# Patient Record
Sex: Female | Born: 1940 | Race: White | Hispanic: No | Smoking: Former smoker
Health system: Southern US, Community
[De-identification: ages and names within clinical notes are randomized; demographics above are authoritative.]

## PROBLEM LIST (undated history)

## (undated) DIAGNOSIS — Z960 Presence of urogenital implants: Secondary | ICD-10-CM

## (undated) DIAGNOSIS — I10 Essential (primary) hypertension: Secondary | ICD-10-CM

## (undated) DIAGNOSIS — N1330 Unspecified hydronephrosis: Secondary | ICD-10-CM

## (undated) DIAGNOSIS — I252 Old myocardial infarction: Secondary | ICD-10-CM

## (undated) DIAGNOSIS — N814 Uterovaginal prolapse, unspecified: Secondary | ICD-10-CM

## (undated) DIAGNOSIS — K219 Gastro-esophageal reflux disease without esophagitis: Secondary | ICD-10-CM

## (undated) DIAGNOSIS — N8111 Cystocele, midline: Secondary | ICD-10-CM

## (undated) DIAGNOSIS — D649 Anemia, unspecified: Secondary | ICD-10-CM

## (undated) DIAGNOSIS — E785 Hyperlipidemia, unspecified: Secondary | ICD-10-CM

## (undated) DIAGNOSIS — R609 Edema, unspecified: Secondary | ICD-10-CM

## (undated) DIAGNOSIS — Z978 Presence of other specified devices: Secondary | ICD-10-CM

## (undated) HISTORY — PX: TUBAL LIGATION: SHX77

## (undated) HISTORY — DX: Old myocardial infarction: I25.2

## (undated) HISTORY — PX: UPPER GASTROINTESTINAL ENDOSCOPY: SHX188

## (undated) HISTORY — PX: CHOLECYSTECTOMY: SHX55

## (undated) HISTORY — DX: Cystocele, midline: N81.11

## (undated) HISTORY — PX: EYE SURGERY: SHX253

## (undated) HISTORY — DX: Uterovaginal prolapse, unspecified: N81.4

## (undated) HISTORY — DX: Essential (primary) hypertension: I10

## (undated) HISTORY — PX: CATARACT EXTRACTION: SUR2

## (undated) HISTORY — DX: Gastro-esophageal reflux disease without esophagitis: K21.9

## (undated) HISTORY — DX: Hyperlipidemia, unspecified: E78.5

## (undated) HISTORY — DX: Unspecified hydronephrosis: N13.30

---

## 2015-01-18 ENCOUNTER — Ambulatory Visit (INDEPENDENT_AMBULATORY_CARE_PROVIDER_SITE_OTHER): Payer: BLUE CROSS/BLUE SHIELD | Admitting: Cardiology

## 2015-01-18 ENCOUNTER — Encounter: Payer: Self-pay | Admitting: Cardiology

## 2015-01-18 ENCOUNTER — Encounter: Payer: Self-pay | Admitting: *Deleted

## 2015-01-18 VITALS — BP 182/71 | HR 65 | Ht <= 58 in | Wt 111.4 lb

## 2015-01-18 DIAGNOSIS — R0989 Other specified symptoms and signs involving the circulatory and respiratory systems: Secondary | ICD-10-CM

## 2015-01-18 DIAGNOSIS — Z01818 Encounter for other preprocedural examination: Secondary | ICD-10-CM

## 2015-01-18 DIAGNOSIS — R011 Cardiac murmur, unspecified: Secondary | ICD-10-CM

## 2015-01-18 DIAGNOSIS — I252 Old myocardial infarction: Secondary | ICD-10-CM | POA: Diagnosis not present

## 2015-01-18 DIAGNOSIS — I10 Essential (primary) hypertension: Secondary | ICD-10-CM | POA: Diagnosis not present

## 2015-01-18 MED ORDER — CARVEDILOL 3.125 MG PO TABS: 3.1250 mg | ORAL_TABLET | Freq: Two times a day (BID) | ORAL | Status: DC

## 2015-01-18 NOTE — Patient Instructions (Addendum)
Medication Instructions:  1.  Start Coreg 3.125 take 2 tablets twice daily    Labwork: BMET  TODAY  Testing/Procedures: Your physician has requested that you have an echocardiogram. Echocardiography is a painless test that uses sound waves to create images of your heart. It provides your doctor with information about the size and shape of your heart and how well your heart's chambers and valves are working. This procedure takes approximately one hour. There are no restrictions for this procedure.  Your physician has requested that you have a carotid duplex. This test is an ultrasound of the carotid arteries in your neck. It looks at blood flow through these arteries that supply the brain with blood. Allow one hour for this exam. There are no restrictions or special instructions.  Your physician has requested that you have an exercise stress myoview. For further information please visit [email or web address]. Please follow instruction sheet, as given.  If these can be done before 01-26-15  Follow-Up: Your physician recommends that you schedule a follow-up appointment in: 3 months with Dr. Irish Lack  Return to see the Pharm D for BP check (if possible on the same day as one of the Stress Test)  Any Other Special Instructions Will Be Listed Below (If Applicable).

## 2015-01-18 NOTE — Progress Notes (Signed)
Cardiology Office Note  NEW PATIENT  Date:  01/18/2015   ID:  Mariah Phillips, DOB [birth year], MRN [id number]  PCP:  No primary care provider on file.  Cardiologist:  Dr. Irish Lack    Chief Complaint  Patient presents with  . Hypertension    uncontrolled and cardiac risk for vag. hyterectomy      History of Present Illness: Mariah Phillips is a 74 y.o. female who presents for cardiac evaluation for cardiac risk for total vaginal hysterectomy surgery and uncontrolled HTN.    In 1984 pt was told she had a heart attack with cholecystectomy.  She never saw a cardiologist. No stress test.  She has no chest pain and no SOB.  No hx of CVA, no diabetes, no thyroid disease.  Hx of HTN and was well controlled on lisinopril but recently her kidney function was abnormal and she was changed to amlodipine and developed lower ext edema.  Meds have since been adjusted but BP has been elevated and difficult to control at times to 008 systolic. She has been given clonidine to take prn, and has taken for systolic > 676 4 times in 1 month.    She has prolapsed uterus and bladder needs tacking. plans for surgery once we have evaluated and BP with improved control.    She is active working night shift at [employer] most of the night.      Past Medical History  Diagnosis Date  . Hypertension   . Uterine prolapse   . Hydronephrosis     bilateral  . Esophageal reflux   . Cystocele, midline   . Old myocardial infarction   . Hyperlipidemia     Past Surgical History  Procedure Laterality Date  . Cholecystectomy    . Cataract extraction  2000/2010     Current Outpatient Prescriptions  Medication Sig Dispense Refill  . cloNIDine (CATAPRES) 0.1 MG tablet Take 1 tablet by mouth daily as needed. If BP is over 170  0  . hydrochlorothiazide (HYDRODIURIL) 12.5 MG tablet Take 1 tablet by mouth daily.  0  . IRON PO Take 1 tablet by mouth 2 (two) times daily. 65 mg    .  lisinopril-hydrochlorothiazide (PRINZIDE,ZESTORETIC) 20-12.5 MG per tablet Take 1 tablet by mouth daily.  4  . omeprazole (PRILOSEC) 20 MG capsule Take 1 capsule by mouth daily.  4  . pravastatin (PRAVACHOL) 20 MG tablet Take 20 mg by mouth daily.    . carvedilol (COREG) 3.125 MG tablet Take 1 tablet (3.125 mg total) by mouth 2 (two) times daily. 180 tablet 3   No current facility-administered medications for this visit.    Allergies:   Amlodipine    Social History:  The patient  reports that she has quit smoking. She does not have any smokeless tobacco history on file.   Family History:  The patient's family history includes Heart Problems in her father; Thyroid cancer in her mother.    ROS:  General:no colds or fevers, no weight changes Skin:no rashes or ulcers HEENT:no blurred vision, no congestion CV:see HPI PUL:see HPI GI:no diarrhea constipation or melena, no indigestion GU:no hematuria, no dysuria MS:no joint pain, no claudication Neuro:no syncope, no lightheadedness Endo:no diabetes, no thyroid disease GU as before  Wt Readings from Last 3 Encounters:  01/18/15 111 lb 6.4 oz (50.531 kg)     PHYSICAL EXAM: VS:  BP 182/71 mmHg  Pulse 65  Ht 4\' 7"  (1.397 m)  Wt 111 lb 6.4 oz (50.531  kg)  BMI 25.89 kg/m2 , BMI Body mass index is 25.89 kg/(m^2). General:Pleasant affect, NAD Skin:Warm and dry, brisk capillary refill HEENT:normocephalic, sclera clear, mucus membranes moist Neck:supple, no JVD, + rt carotid bruits ? Soft Lt bruit, and rt subclavian bruit.  Heart:S1S2 RRR with 2/6 systolic outflow murmur, no gallup, rub or click Lungs:clear without rales, rhonchi, or wheezes IDP:OEUM, non tender, + BS, do not palpate liver spleen or masses Ext:no lower ext edema, 2+ pedal pulses, 2+ radial pulses Neuro:alert and oriented X 3, MAE, follows commands, + facial symmetry    EKG:  EKG is ordered today. The ekg ordered today demonstrates SR no acute abnormality.     Recent Labs: No results found for requested labs within last 365 days.    Lipid Panel No results found for: CHOL, TRIG, HDL, CHOLHDL, VLDL, LDLCALC, LDLDIRECT     Other studies Reviewed: Additional studies/ records that were reviewed today include: gyn notes.   ASSESSMENT AND PLAN:  1.  HTN uncontrolled- add coreg 3.126 BID to medication and follow up with pharmacy clinic for BP control, will check BMP today. Will follow up with Dr. Irish Lack for routine follow up depending on results of tests.  2. + murmur, check Echo  3. + carotid and subclavian bruit.check carotid dopplers.  4. Hx of MI, no records, will do exercise myoview to eval risk factor for surgery.   Will call her with those results.  If abnormal may need further testing.   5. Hyperlipidemia followed by PCP on statin, continue   Current medicines are reviewed with the patient today.  The patient Has no concerns regarding medicines.  The following changes have been made:  See above Labs/ tests ordered today include:see above  Disposition:   FU:  see above  Signed, Isaiah Serge, NP  01/18/2015 5:11 PM    [facility] [place], [place], [place] [address], [place] Phone: [phone]; Fax: [phone]  I have examined the patient and reviewed assessment and plan and discussed with patient.  Agree with above as stated.  History of MI. Now needs hysterectomy. Will check carotid Doppler due to bruit. Check nuclear stress test to rule out ischemia. Would also check echocardiogram to evaluate for structural heart disease given murmur on exam.  VARANASI,JAYADEEP S.

## 2015-01-19 ENCOUNTER — Telehealth: Payer: Self-pay | Admitting: *Deleted

## 2015-01-19 ENCOUNTER — Telehealth (HOSPITAL_COMMUNITY): Payer: Self-pay | Admitting: Radiology

## 2015-01-19 LAB — BASIC METABOLIC PANEL
BUN: 27 mg/dL — ABNORMAL HIGH (ref 6–23)
CO2: 28 mEq/L (ref 19–32)
Calcium: 8.9 mg/dL (ref 8.4–10.5)
Chloride: 103 mEq/L (ref 96–112)
Creatinine, Ser: 1.44 mg/dL — ABNORMAL HIGH (ref 0.40–1.20)
GFR: 37.9 mL/min — ABNORMAL LOW (ref >60.00)
Glucose, Bld: 89 mg/dL (ref 70–99)
Potassium: 3.8 mEq/L (ref 3.5–5.1)
Sodium: 142 mEq/L (ref 135–145)

## 2015-01-19 NOTE — Telephone Encounter (Signed)
Received direct call from pt who is calling for clarification on Coreg.  I reviewed chart and told pt she should take Coreg 3.125 mg by mouth twice daily. Pt confirms this is the directions on the bottle she picked up from the pharmacy.

## 2015-01-19 NOTE — Addendum Note (Signed)
Addended by: Michae Kava on: 01/19/2015 12:07 PM   Modules accepted: Orders, SmartSet

## 2015-01-19 NOTE — Telephone Encounter (Signed)
Patient given detailed instructions per Myocardial Perfusion Study Information Sheet for test on 01/23/15 at 8:30. Patient Notified to arrive 15 minutes early, and that it is imperative to arrive on time for appointment to keep from having the test rescheduled. Patient verbalized understanding. EHK

## 2015-01-22 ENCOUNTER — Ambulatory Visit: Payer: Self-pay | Admitting: Cardiology

## 2015-01-23 ENCOUNTER — Ambulatory Visit (INDEPENDENT_AMBULATORY_CARE_PROVIDER_SITE_OTHER): Payer: BLUE CROSS/BLUE SHIELD | Admitting: Pharmacist

## 2015-01-23 ENCOUNTER — Ambulatory Visit (INDEPENDENT_AMBULATORY_CARE_PROVIDER_SITE_OTHER): Payer: BLUE CROSS/BLUE SHIELD

## 2015-01-23 ENCOUNTER — Encounter: Payer: Self-pay | Admitting: Pharmacist

## 2015-01-23 ENCOUNTER — Telehealth: Payer: Self-pay | Admitting: *Deleted

## 2015-01-23 ENCOUNTER — Ambulatory Visit (HOSPITAL_COMMUNITY)
Admission: RE | Admit: 2015-01-23 | Discharge: 2015-01-23 | Disposition: A | Discharge status: Home / Self Care | Payer: BLUE CROSS/BLUE SHIELD | Source: Ambulatory Visit | Attending: Cardiology | Admitting: Cardiology

## 2015-01-23 ENCOUNTER — Ambulatory Visit (HOSPITAL_BASED_OUTPATIENT_CLINIC_OR_DEPARTMENT_OTHER): Payer: BLUE CROSS/BLUE SHIELD

## 2015-01-23 ENCOUNTER — Other Ambulatory Visit: Payer: Self-pay

## 2015-01-23 VITALS — BP 162/84 | HR 64 | Ht <= 58 in | Wt 112.0 lb

## 2015-01-23 DIAGNOSIS — E785 Hyperlipidemia, unspecified: Secondary | ICD-10-CM | POA: Diagnosis not present

## 2015-01-23 DIAGNOSIS — I252 Old myocardial infarction: Secondary | ICD-10-CM | POA: Diagnosis not present

## 2015-01-23 DIAGNOSIS — Z01818 Encounter for other preprocedural examination: Secondary | ICD-10-CM

## 2015-01-23 DIAGNOSIS — I352 Nonrheumatic aortic (valve) stenosis with insufficiency: Secondary | ICD-10-CM | POA: Diagnosis not present

## 2015-01-23 DIAGNOSIS — Z87891 Personal history of nicotine dependence: Secondary | ICD-10-CM | POA: Insufficient documentation

## 2015-01-23 DIAGNOSIS — I10 Essential (primary) hypertension: Secondary | ICD-10-CM | POA: Diagnosis not present

## 2015-01-23 DIAGNOSIS — R011 Cardiac murmur, unspecified: Secondary | ICD-10-CM | POA: Insufficient documentation

## 2015-01-23 DIAGNOSIS — R0989 Other specified symptoms and signs involving the circulatory and respiratory systems: Secondary | ICD-10-CM | POA: Diagnosis not present

## 2015-01-23 DIAGNOSIS — I6523 Occlusion and stenosis of bilateral carotid arteries: Secondary | ICD-10-CM | POA: Diagnosis not present

## 2015-01-23 DIAGNOSIS — I071 Rheumatic tricuspid insufficiency: Secondary | ICD-10-CM | POA: Diagnosis not present

## 2015-01-23 DIAGNOSIS — I2510 Atherosclerotic heart disease of native coronary artery without angina pectoris: Secondary | ICD-10-CM

## 2015-01-23 DIAGNOSIS — I340 Nonrheumatic mitral (valve) insufficiency: Secondary | ICD-10-CM | POA: Insufficient documentation

## 2015-01-23 DIAGNOSIS — Z01810 Encounter for preprocedural cardiovascular examination: Secondary | ICD-10-CM | POA: Diagnosis not present

## 2015-01-23 DIAGNOSIS — R079 Chest pain, unspecified: Secondary | ICD-10-CM | POA: Diagnosis not present

## 2015-01-23 HISTORY — DX: Essential (primary) hypertension: I10

## 2015-01-23 LAB — MYOCARDIAL PERFUSION IMAGING
LV dias vol: 75 mL
LV sys vol: 28 mL
Peak HR: 98 {beats}/min
RATE: 0.23
Rest HR: 54 {beats}/min
SDS: 1
SRS: 0
SSS: 1
TID: 1.21

## 2015-01-23 MED ORDER — TECHNETIUM TC 99M SESTAMIBI GENERIC - CARDIOLITE
31.6000 | Freq: Once | INTRAVENOUS | Status: AC | PRN
  Administered 2015-01-23: 32 via INTRAVENOUS

## 2015-01-23 MED ORDER — TECHNETIUM TC 99M SESTAMIBI GENERIC - CARDIOLITE
10.9000 | Freq: Once | INTRAVENOUS | Status: AC | PRN
  Administered 2015-01-23: 10.9 via INTRAVENOUS

## 2015-01-23 MED ORDER — LISINOPRIL 20 MG PO TABS: 20.0000 mg | ORAL_TABLET | Freq: Every day | ORAL | Status: DC

## 2015-01-23 MED ORDER — CLONIDINE HCL 0.1 MG PO TABS: 0.1000 mg | ORAL_TABLET | Freq: Two times a day (BID) | ORAL | Status: DC

## 2015-01-23 MED ORDER — REGADENOSON 0.4 MG/5ML IV SOLN
0.4000 mg | Freq: Once | INTRAVENOUS | Status: AC
  Administered 2015-01-23: 0.4 mg via INTRAVENOUS

## 2015-01-23 MED ORDER — HYDROCHLOROTHIAZIDE 25 MG PO TABS: 25.0000 mg | ORAL_TABLET | Freq: Every day | ORAL | Status: DC

## 2015-01-23 NOTE — Progress Notes (Signed)
Patient ID: Mariah Casebolt                  DOB: 12/09/1940, 74 yo                       MRN: [id number]     HPI: Mariah Phillips is a 74 y.o. female referred by Dr. Dorene Ar to HTN clinic. PMH is significant for HT, HLD, and old MI in 76. Patient also has a prolapsed uterus and her bladder needs tacking - plans for surgery once BP is controlled. Patient reports that her BP used to be well controlled on lisinopril. Her kidney function then became abnormal and the lisinopril was d/ced and amlodipine started. Pt developed lower extremity swelling with amlodipine. Meds have since been adjusted but BP has been elevated and difficult to control, with some systolic readings close to 200. Only BMET on file is from 8/25 with SCr 1.44 and K 3.8, est CrCl 80mL/min. Patient reports that she had been taking lisinopril for a few weeks again at that time (reports it was d/ced for about a week in early August d/t poor kidney function). However, no baseline on file.  Current HTN meds: clonidine 0.1mg  prn sBP >174mmHg, carvedilol 3.125mg  BID (has been taking for a week), lisinopril-HCTZ 20/12.5mg  daily, HCTZ 12.5mg  daily Previously tried: amlodipine for a week (lower extremity swelling) BP goal: < 150/72mmHg. Today in clinic 162/84, pulse 64 - pt did not take BP meds last night or this AM  Family History: Heart problems in her father, thyroid cancer in her mother.  Social History: Patient reports that she has quit smoking. She does not have any smokeless tobacco history on file.  Diet: Patient admits that diet is poor - she likes eating chips and fast food. Provided patient with low sodium diet handout and discussed healthier options such as lean meats, whole grains, vegetables and fruit, and low fat dairy rather than processed foods to help control her BP.  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   Home BP readings: of note, patient works 3rd shift and usually sleeps  8:30am-1pm and 6pm-9pm. These BP readings are from a weekend day when she was on a normal sleep schedule  7:30 am - 189/? (waking up, pre-BP meds) 9:30am to 12pm - 120s-140s/50s (AM meds taken ~9:30) 1:30-3:40pm - 150s/50s 6:30-8pm - 140s-160s/50s-60s 9-10pm - 170s-190s/50s-60s (highest 196/57 - took clonidine)  BP tends to be at goal for 3-4 hours after taking AM BP meds (carvedilol, lisinopril-HCTZ, HCTZ) with BP trending up throughout the day and peaking with sBP 180-190s around 9-10pm. BP likely remains elevated through the night as AM systolic readings before pt takes her medication are in the 180s.  Wt Readings from Last 3 Encounters:  01/23/15 112 lb (50.803 kg)  01/23/15 111 lb (50.349 kg)  01/18/15 111 lb 6.4 oz (50.531 kg)   BP Readings from Last 3 Encounters:  01/23/15 162/84  01/18/15 182/71   Pulse Readings from Last 3 Encounters:  01/23/15 64  01/18/15 65    Renal function: Estimated Creatinine Clearance: 22.4 mL/min (by C-G formula based on Cr of 1.44).  Past Medical History  Diagnosis Date  . Hypertension   . Uterine prolapse   . Hydronephrosis     bilateral  . Esophageal reflux   . Cystocele, midline   . Old myocardial infarction   . Hyperlipidemia     Current Outpatient Prescriptions on  File Prior to Visit  Medication Sig Dispense Refill  . carvedilol (COREG) 3.125 MG tablet Take 1 tablet (3.125 mg total) by mouth 2 (two) times daily. 180 tablet 3  . IRON PO Take 1 tablet by mouth 2 (two) times daily. 65 mg    . omeprazole (PRILOSEC) 20 MG capsule Take 1 capsule by mouth daily.  4  . pravastatin (PRAVACHOL) 20 MG tablet Take 20 mg by mouth daily.     No current facility-administered medications on file prior to visit.    Allergies  Allergen Reactions  . Amlodipine Swelling    Lower extremity edema     Assessment/Plan: 1. Hypertension - Patient's BP is at goal <150/90 for a few hours after she takes her AM blood pressure medications. However,  for a majority of the day she is above goal as her BP continues to rise throughout the day and peaks at 180s-190s/50s around 9 to 10pm. BP needs to be controlled d/t prolapsed bladder and bladder that needs tacking - surgery will be scheduled once BP control improves. Given lack of trends with SCr (only one on file from 01/18/15 - 1.44), hesitant to change lisinopril dose at this time or add on aldosterone antagonist. Pt also with allergy to amlodipine (lower extremity edema). Will focus on maximizing current medications rather than adding additional agents at this time. Will split up lisinopril-HCTZ combo + separate HCTZ which patient is currently taking. Fixed dosing is limiting our option to split up BP meds to force dipping later on in the day, and she is still requiring 2 pills given addition of extra HCTZ tablet. Sent new rx for lisinopril 20mg  and HCTZ 25mg  daily with instructions for patient to move lisinopril dosing to the PM when her BP trends higher. Will keep HCTZ in the AM due to diuretic effects. Instructed patient to increase clonidine from PRN to scheduled BID with additional instructions to titrate up to 2 tablets BID if her BP is still consistently elevated, new rx sent.  New BP regimen is as follows -  Morning BP medicines: -carvedilol 3.125mg  -hydrochlorothiazide 25mg  -clonidine 0.1mg   Evening BP medicines: -carvedilol 3.125mg  -clonidine 0.1mg  -lisinopril 20mg   Also discussed importance of low sodium diet with patient - she will try to limit intake of chips and fast food. Will f/u with patient in 2 weeks in BP clinic. If BP elevated at that time, could consider up titration of lisinopril if SCr is stable (would recheck at that visit) or addition of hydralazine. Would not increase carvedilol dose since HR already in low 60s. Hesitated to add hydralazine at today's visit given TID dosing regimen and ability to maximize current regimen.   Mariah Phillips, PharmD [facility] [address], [place], [place] [address] Phone: [phone]; Fax: [phone] 01/23/2015 10:07 AM

## 2015-01-23 NOTE — Telephone Encounter (Signed)
Unable to reach pt or leave a message  

## 2015-01-23 NOTE — Patient Instructions (Addendum)
Morning BP medicines: -carvedilol 3.125mg  -hydrochlorothiazide 25mg  -clonidine 0.1mg   Evening BP medicines: -carvedilol 3.125mg  -clonidine 0.1mg  -lisinopril 20mg   Try to limit intake of chips and fast food  If you notice that your blood pressure is still elevated (higher than 150/90), increase your clonidine up to 2 tablets twice a day Call clinic if you have any questions at [phone] We will see you in 2 weeks for blood pressure follow up on Friday, September 9 at 8:30am

## 2015-01-23 NOTE — Telephone Encounter (Signed)
-----   Message from Erlene Quan, [place] sent at 01/22/2015  7:57 AM EDT ----- Please tell the pt to stop HCTZ and take her clonidine 0.1mg  BID. F/U in pharmacy clinic and with dr Irish Lack as scheduled.  Kerin Ransom PA-C 01/22/2015 7:57 AM

## 2015-01-23 NOTE — Telephone Encounter (Signed)
Patient was see in pharmacy HTN clinic this AM - please refer to note from 8/30 regarding BP medication plan. Pt in agreement with plan and will f/u again in pharmacy clinic in 2 weeks.

## 2015-01-25 ENCOUNTER — Telehealth: Payer: Self-pay | Admitting: *Deleted

## 2015-01-25 ENCOUNTER — Encounter: Payer: Self-pay | Admitting: *Deleted

## 2015-01-25 ENCOUNTER — Telehealth: Payer: Self-pay | Admitting: Interventional Cardiology

## 2015-01-25 NOTE — Telephone Encounter (Signed)
-----   Message from Jettie Booze, MD sent at 01/24/2015  5:52 PM EDT ----- Normal LV function. Mild aortic stenosis which will need to be followed in the future.  OK to proceed with surgery.  F/u 1 year

## 2015-01-25 NOTE — Telephone Encounter (Addendum)
Spoke with pt and she gave permission to speak with Thurnell Garbe. Granddaughter states that BP is still running high: 206/80 at 5pm and checked again just prior to getting off the phone at 5:20pm and it was 203/69, HR 61. Grandaughter states that pt denies HA, SHOB, CP, or dizziness. Pt's BP comes down after taking medications but does continue to jump around throughout the day. Pt is due to go back to work tonight and cant work if SBP is above 165 according to their policy. Pt and Holly want to know if ok to go to work tonight and if not she will need a new note stating when she can return to work. Belinda Fisher that I would send this information to Dr. Irish Lack for review and advisement.

## 2015-01-25 NOTE — Telephone Encounter (Signed)
New message     Pt has note to return to work tonight but b/p is still running high  Pt c/o BP issue: STAT if pt c/o blurred vision, one-sided weakness or slurred speech  1. What are your last 5 BP readings? 182/63; 190/62; 179/67; 203/75  2. Are you having any other symptoms (ex. Dizziness, headache, blurred vision, passed out)? No  3. What is your BP issue? B/p running higher than normal

## 2015-01-25 NOTE — Telephone Encounter (Signed)
Increase Coreg to 6.25 BID.  She needs to have her home cuff correlated in our office.  OK to givw her a new note to keep her out of work if her BP is still over 165 consistently.

## 2015-01-25 NOTE — Telephone Encounter (Signed)
Spoke with Granddaughter, Mariah Phillips, and informed her that Dr. Irish Lack said to have pt increase Coreg to 6.25 BID and have her home cuff correlated in our office.  Advised to bring home BP cuff to HTN clinic appt on 9/9.  Informed Mariah Phillips ok to give note to keep her out of work if her BP is still over 165 consistently.  Asked if letter needed to be faxed or placed at front.  Mariah Phillips states to just place at front and they would come by tomorrow pick up.

## 2015-01-25 NOTE — Telephone Encounter (Addendum)
Spoke with pt and made her aware of results. Pt asked that I send clearance information to Dr. Servando Salina office. Will send this information to Dr. Garwin Brothers.

## 2015-01-26 ENCOUNTER — Encounter: Payer: Self-pay | Admitting: *Deleted

## 2015-02-02 ENCOUNTER — Encounter: Payer: Self-pay | Admitting: *Deleted

## 2015-02-02 ENCOUNTER — Encounter: Payer: Self-pay | Admitting: Pharmacist

## 2015-02-02 ENCOUNTER — Ambulatory Visit (INDEPENDENT_AMBULATORY_CARE_PROVIDER_SITE_OTHER): Payer: BLUE CROSS/BLUE SHIELD | Admitting: Pharmacist

## 2015-02-02 ENCOUNTER — Telehealth: Payer: Self-pay | Admitting: Interventional Cardiology

## 2015-02-02 VITALS — BP 132/64 | HR 65 | Wt 112.9 lb

## 2015-02-02 DIAGNOSIS — I10 Essential (primary) hypertension: Secondary | ICD-10-CM

## 2015-02-02 LAB — BASIC METABOLIC PANEL
BUN: 28 mg/dL — ABNORMAL HIGH (ref 6–23)
CALCIUM: 8.4 mg/dL (ref 8.4–10.5)
CO2: 30 mEq/L (ref 19–32)
Chloride: 105 mEq/L (ref 96–112)
Creatinine, Ser: 1.38 mg/dL — ABNORMAL HIGH (ref 0.40–1.20)
GFR: 39.8 mL/min — AB (ref >60.00)
GLUCOSE: 135 mg/dL — AB (ref 70–99)
Potassium: 3.5 mEq/L (ref 3.5–5.1)
SODIUM: 143 meq/L (ref 135–145)

## 2015-02-02 NOTE — Telephone Encounter (Signed)
Returned Tindall (pt's granddaughter) back and informed her that the pt's Carotid Duplex came back normal, that the pt had very minimal blockage, per Cecilie Kicks, FNP-C.  She was very appreciative and verbalized understanding.

## 2015-02-02 NOTE — Telephone Encounter (Signed)
New message     Pt granddaughter calling about carotid results Please call to discuss

## 2015-02-02 NOTE — Progress Notes (Addendum)
Patient ID: Mariah Phillips               DOB: [birth year], 74 yo                     MRN: [id number]     HPI: Mariah Phillips is a 74 y.o. female who presents for 2 week f/u in HTN clinic. PMH is significant for HTN, HLD, and old MI in 66. Patient was referred to clinic to control her BP and for cardiac clearance before upcoming procedure - patient needs hysterectomy and her bladder tacked. Plans for surgery once BP is controlled and pt is given cardiac clearance (Dr. Matilde Sprang with [facility] urology and Dr. Garwin Brothers at [facility] gyn are performing procedures). Patient also works 3rd shift at [employer] and is not allowed to work if her sBP is consistently >165. She presents today hoping for a letter so that she can return to work as well.  Patient reports that her BP used to be well controlled on lisinopril. Her kidney function then became abnormal and the lisinopril was d/ced and amlodipine started. Pt developed lower extremity swelling with amlodipine. Meds have since been adjusted but BP has been elevated and difficult to control, with some systolic readings close to 200. Only BMET on file is from 8/25 with SCr 1.44 and K 3.8, est CrCl 98mL/min. Patient reports that she had been taking lisinopril for a few weeks again at that time (reports it was d/ced for about a week in early August d/t poor kidney function). However, no baseline on file. Rechecked today - SCr trending down to 1.38.  At last visit, patient's clonidine was increased from PRN to 0.1mg  BID with instructions to titrate up to 0.2mg  BID if sBP still consistently elevated >170. She reports that for the past week, she has been taking clonidine 0.2mg  BID and has noticed some swelling in her ankles. She states that it is about the same as when she took 0.1mg  BID and is much less than the swelling she had in the past with amlodipine. Patient's carvedilol was also increased from 3.125mg  BID to 6.25mg  BID by Dr. Irish Lack 1 week ago (see telephone note  01/25/15).  Current HTN meds: Morning BP medicines: -carvedilol 6.25mg  -hydrochlorothiazide 25mg  -clonidine 0.2mg   Evening BP medicines: -carvedilol 6.25mg  -clonidine 0.2mg  -lisinopril 20mg   Previously tried: amlodipine for a week (lower extremity swelling) BP goal: <150/96mmHg  BP readings in clinic: -Clinic cuff: 132/64 -Home arm cuff: 150/48 -New home wrist cuff (granddaughter purchased yesterday): 143/69 **Home BP readings range from from 111-178/43-67. Pulse has remained stable in 50s-low 60s since Dr. Irish Lack titrated up carvedilol last week. However, home cuff shows large discrepancy with clinic reading - sBP is ~24mmHg higher on home cuff compared to clinic, with new wrist cuff ~41mmHg higher. Patient's BP does trend up at night, however readings are likely better controlled than they appear on patient's home reading given this discrepancy.  Home BP readings: of note, patient works 3rd shift and usually sleeps 8:30am-1pm and 6pm-9pm. These BP readings are from a weekend day when she was on a normal sleep schedule. Patient checks her BP 12-14 times a day and brought in papers with her daily readings from the past few weeks. BP on average tends to be well controlled during the day, and then starts to increase closer to bed time and remains high in the morning until she takes her morning BP medications.  6:10am-8:30am - 157-183/49-66, pulse 51-62 (took meds at  8:30) 9:30am-5pm - 111-147/43-62, pulse 51-62 6pm-10pm - 145-178/51-66, pulse 52-64   Family History: Heart problems in her father, thyroid cancer in her mother.  Social History: Patient reports that she has quit smoking. She does not have any smokeless tobacco history on file.  Diet: Patient admits that diet is poor - she likes eating chips and fast food. At last visit 2 weeks ago, provided patient with low sodium diet handout and discussed healthier options such as lean meats, whole grains, vegetables and fruit, and low  fat dairy rather than processed foods to help control her BP.  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   Wt Readings from Last 3 Encounters:  02/02/15 112 lb 14 oz (51.2 kg)  01/23/15 112 lb (50.803 kg)  01/23/15 111 lb (50.349 kg)   BP Readings from Last 3 Encounters:  02/02/15 132/64  01/23/15 162/84  01/18/15 182/71   Pulse Readings from Last 3 Encounters:  02/02/15 65  01/23/15 64  01/18/15 65    Renal function: Estimated Creatinine Clearance: 22.5 mL/min (by C-G formula based on Cr of 1.44).  Past Medical History  Diagnosis Date  . Hypertension   . Uterine prolapse   . Hydronephrosis     bilateral  . Esophageal reflux   . Cystocele, midline   . Old myocardial infarction   . Hyperlipidemia     Current Outpatient Prescriptions on File Prior to Visit  Medication Sig Dispense Refill  . carvedilol (COREG) 3.125 MG tablet Take 1 tablet (3.125 mg total) by mouth 2 (two) times daily. 180 tablet 3  . cloNIDine (CATAPRES) 0.1 MG tablet Take 1 tablet (0.1 mg total) by mouth 2 (two) times daily. 60 tablet 3  . hydrochlorothiazide (HYDRODIURIL) 25 MG tablet Take 1 tablet (25 mg total) by mouth daily. 90 tablet 3  . IRON PO Take 1 tablet by mouth 2 (two) times daily. 65 mg    . lisinopril (PRINIVIL,ZESTRIL) 20 MG tablet Take 1 tablet (20 mg total) by mouth daily. 90 tablet 3  . omeprazole (PRILOSEC) 20 MG capsule Take 1 capsule by mouth daily.  4  . pravastatin (PRAVACHOL) 20 MG tablet Take 20 mg by mouth daily.     No current facility-administered medications on file prior to visit.    Allergies  Allergen Reactions  . Amlodipine Swelling    Lower extremity edema     Assessment/Plan:  1. Hypertension - Patient originally referred to HTN clinic to improve BP control for upcoming surgeries. Patient checks her BP 12-14 times a day on each arm. There is variation throughout the day, with most daytime readings at goal  <150/45mmHg. Nighttime and morning readings before she takes her AM BP medication tend to be elevated with systolic readings 818-299. However, during the day, systolic readings range from 111-150. Patient brought her home BP cuff to clinic today and a discrepancy of 64mmHg was noted, with her home cuff trending higher. Patient's BP likely lower and even better controlled than recorded numbers based on suboptimal accuracy of home cuff. Granddaughter bought a wrist BP cuff yesterday and brought that in today too, which recorded a systolic reading ~37JIRC higher than clinic reading. Cuff readings tend to be less accurate, although patient's new cuff is closer in accuracy than her old arm cuff. Given that home systolic readings ranged 789-381O, this was more likely readings ~90s-150s and patient is likely at goal most of the day. She does not report  any dizziness. Will not make any medication changes today. Advised patient that if she notices BP is still trending high at night with her new cuff to call clinic. Renal function is stable and K is low normal, have room to titrate up lisinopril to 40mg  daily. Also spoke with Dr. Irish Lack regarding patient's cardiac clearance and required form for returning back to work. MDs performing patient's hysterectomy and bladder tack have received cardiac clearance already. Patient was provided with a signed note allowing her to return to work tonight given improved BP control.    Megan E. Supple, PharmD [facility] [address] [place], [place] [address] Phone: [phone]; Fax: [phone] 02/02/2015 12:23 PM

## 2015-02-16 ENCOUNTER — Other Ambulatory Visit: Payer: Self-pay | Admitting: Urology

## 2015-02-19 ENCOUNTER — Telehealth: Payer: Self-pay | Admitting: Pharmacist

## 2015-02-19 NOTE — Telephone Encounter (Signed)
Patient called to report side effects that she thinks are due to her blood pressure medication. She has noticed swelling in her legs over the past few weeks as well as bilateral tingling in her arms last night as well as one time a few weeks ago.  She denies chest pain, SOB, or headache. Patient's clonidine was increased from PRN to 0.2mg  BID within the last month and can cause edema (3%) as well as parasthesias (rare ~1%). She does report that her BP readings have been improved and are averaging 140s/50s. Advised patient to cut clonidine dose in half to 0.1mg  BID and to monitor for symptom improvement as well as BP. If symptoms do not improve, advised her to follow up with PCP and [place]. If symptoms improve but BP starts to trend up, advised patient to call. She has not had her bladder tacking procedure yet and BP needs to stay controlled for surgery. Would advise increasing lisinopril to 40mg  daily and rechecking BMET a week after (SCr currently stable, K low normal). Patient understands and is in agreement with plan.

## 2015-02-22 ENCOUNTER — Inpatient Hospital Stay (HOSPITAL_COMMUNITY): Payer: BLUE CROSS/BLUE SHIELD

## 2015-02-22 ENCOUNTER — Inpatient Hospital Stay (HOSPITAL_COMMUNITY)
Admission: EM | Admit: 2015-02-22 | Discharge: 2015-03-01 | DRG: 682 | Disposition: A | Discharge status: Home / Self Care | Payer: BLUE CROSS/BLUE SHIELD | Attending: Internal Medicine | Admitting: Internal Medicine

## 2015-02-22 ENCOUNTER — Encounter (HOSPITAL_COMMUNITY): Payer: Self-pay | Admitting: Emergency Medicine

## 2015-02-22 DIAGNOSIS — N139 Obstructive and reflux uropathy, unspecified: Secondary | ICD-10-CM | POA: Diagnosis present

## 2015-02-22 DIAGNOSIS — N131 Hydronephrosis with ureteral stricture, not elsewhere classified: Secondary | ICD-10-CM | POA: Diagnosis present

## 2015-02-22 DIAGNOSIS — Z79899 Other long term (current) drug therapy: Secondary | ICD-10-CM

## 2015-02-22 DIAGNOSIS — I130 Hypertensive heart and chronic kidney disease with heart failure and stage 1 through stage 4 chronic kidney disease, or unspecified chronic kidney disease: Secondary | ICD-10-CM | POA: Diagnosis present

## 2015-02-22 DIAGNOSIS — N189 Chronic kidney disease, unspecified: Secondary | ICD-10-CM

## 2015-02-22 DIAGNOSIS — R0989 Other specified symptoms and signs involving the circulatory and respiratory systems: Secondary | ICD-10-CM | POA: Diagnosis present

## 2015-02-22 DIAGNOSIS — N1330 Unspecified hydronephrosis: Secondary | ICD-10-CM | POA: Diagnosis present

## 2015-02-22 DIAGNOSIS — N393 Stress incontinence (female) (male): Secondary | ICD-10-CM | POA: Diagnosis present

## 2015-02-22 DIAGNOSIS — I5031 Acute diastolic (congestive) heart failure: Secondary | ICD-10-CM | POA: Diagnosis present

## 2015-02-22 DIAGNOSIS — N3289 Other specified disorders of bladder: Secondary | ICD-10-CM | POA: Diagnosis not present

## 2015-02-22 DIAGNOSIS — E785 Hyperlipidemia, unspecified: Secondary | ICD-10-CM | POA: Diagnosis present

## 2015-02-22 DIAGNOSIS — N138 Other obstructive and reflux uropathy: Secondary | ICD-10-CM | POA: Diagnosis present

## 2015-02-22 DIAGNOSIS — R0602 Shortness of breath: Secondary | ICD-10-CM

## 2015-02-22 DIAGNOSIS — M109 Gout, unspecified: Secondary | ICD-10-CM | POA: Diagnosis present

## 2015-02-22 DIAGNOSIS — R319 Hematuria, unspecified: Secondary | ICD-10-CM | POA: Diagnosis present

## 2015-02-22 DIAGNOSIS — Z888 Allergy status to other drugs, medicaments and biological substances status: Secondary | ICD-10-CM | POA: Diagnosis not present

## 2015-02-22 DIAGNOSIS — R509 Fever, unspecified: Secondary | ICD-10-CM | POA: Diagnosis not present

## 2015-02-22 DIAGNOSIS — K639 Disease of intestine, unspecified: Secondary | ICD-10-CM

## 2015-02-22 DIAGNOSIS — R339 Retention of urine, unspecified: Secondary | ICD-10-CM

## 2015-02-22 DIAGNOSIS — I252 Old myocardial infarction: Secondary | ICD-10-CM

## 2015-02-22 DIAGNOSIS — N1339 Other hydronephrosis: Secondary | ICD-10-CM | POA: Diagnosis not present

## 2015-02-22 DIAGNOSIS — K449 Diaphragmatic hernia without obstruction or gangrene: Secondary | ICD-10-CM | POA: Diagnosis present

## 2015-02-22 DIAGNOSIS — N814 Uterovaginal prolapse, unspecified: Secondary | ICD-10-CM | POA: Diagnosis present

## 2015-02-22 DIAGNOSIS — Z808 Family history of malignant neoplasm of other organs or systems: Secondary | ICD-10-CM | POA: Diagnosis not present

## 2015-02-22 DIAGNOSIS — J90 Pleural effusion, not elsewhere classified: Secondary | ICD-10-CM | POA: Diagnosis not present

## 2015-02-22 DIAGNOSIS — Z01810 Encounter for preprocedural cardiovascular examination: Secondary | ICD-10-CM | POA: Diagnosis not present

## 2015-02-22 DIAGNOSIS — Z87891 Personal history of nicotine dependence: Secondary | ICD-10-CM

## 2015-02-22 DIAGNOSIS — R0600 Dyspnea, unspecified: Secondary | ICD-10-CM

## 2015-02-22 DIAGNOSIS — J811 Chronic pulmonary edema: Secondary | ICD-10-CM | POA: Diagnosis present

## 2015-02-22 DIAGNOSIS — D638 Anemia in other chronic diseases classified elsewhere: Secondary | ICD-10-CM | POA: Diagnosis present

## 2015-02-22 DIAGNOSIS — K219 Gastro-esophageal reflux disease without esophagitis: Secondary | ICD-10-CM | POA: Diagnosis present

## 2015-02-22 DIAGNOSIS — J9811 Atelectasis: Secondary | ICD-10-CM | POA: Diagnosis not present

## 2015-02-22 DIAGNOSIS — N179 Acute kidney failure, unspecified: Secondary | ICD-10-CM | POA: Diagnosis present

## 2015-02-22 DIAGNOSIS — D649 Anemia, unspecified: Secondary | ICD-10-CM | POA: Diagnosis present

## 2015-02-22 DIAGNOSIS — I10 Essential (primary) hypertension: Secondary | ICD-10-CM | POA: Diagnosis present

## 2015-02-22 DIAGNOSIS — I110 Hypertensive heart disease with heart failure: Secondary | ICD-10-CM | POA: Diagnosis not present

## 2015-02-22 DIAGNOSIS — R0902 Hypoxemia: Secondary | ICD-10-CM | POA: Diagnosis present

## 2015-02-22 DIAGNOSIS — Z9849 Cataract extraction status, unspecified eye: Secondary | ICD-10-CM | POA: Diagnosis not present

## 2015-02-22 DIAGNOSIS — E876 Hypokalemia: Secondary | ICD-10-CM | POA: Diagnosis present

## 2015-02-22 DIAGNOSIS — Z960 Presence of urogenital implants: Secondary | ICD-10-CM | POA: Diagnosis not present

## 2015-02-22 LAB — CBC WITH DIFFERENTIAL/PLATELET
Basophils Absolute: 0 10*3/uL (ref 0.0–0.1)
Basophils Relative: 0 %
EOS ABS: 0.1 10*3/uL (ref 0.0–0.7)
EOS PCT: 1 %
HCT: 27.5 % — ABNORMAL LOW (ref 36.0–46.0)
Hemoglobin: 9.5 g/dL — ABNORMAL LOW (ref 12.0–15.0)
LYMPHS ABS: 1.3 10*3/uL (ref 0.7–4.0)
Lymphocytes Relative: 17 %
MCH: 28.8 pg (ref 26.0–34.0)
MCHC: 34.5 g/dL (ref 30.0–36.0)
MCV: 83.3 fL (ref 78.0–100.0)
Monocytes Absolute: 0.7 10*3/uL (ref 0.1–1.0)
Monocytes Relative: 9 %
Neutro Abs: 5.8 10*3/uL (ref 1.7–7.7)
Neutrophils Relative %: 73 %
PLATELETS: 214 10*3/uL (ref 150–400)
RBC: 3.3 MIL/uL — AB (ref 3.87–5.11)
RDW: 13.8 % (ref 11.5–15.5)
WBC: 8 10*3/uL (ref 4.0–10.5)

## 2015-02-22 LAB — CBC
HEMATOCRIT: 28.1 % — AB (ref 36.0–46.0)
HEMOGLOBIN: 9.6 g/dL — AB (ref 12.0–15.0)
MCH: 29 pg (ref 26.0–34.0)
MCHC: 34.2 g/dL (ref 30.0–36.0)
MCV: 84.9 fL (ref 78.0–100.0)
Platelets: 258 10*3/uL (ref 150–400)
RBC: 3.31 MIL/uL — AB (ref 3.87–5.11)
RDW: 14 % (ref 11.5–15.5)
WBC: 9.8 10*3/uL (ref 4.0–10.5)

## 2015-02-22 LAB — BASIC METABOLIC PANEL
Anion gap: 13 (ref 5–15)
BUN: 31 mg/dL — AB (ref 6–20)
CALCIUM: 7.7 mg/dL — AB (ref 8.9–10.3)
CO2: 28 mmol/L (ref 22–32)
CREATININE: 2.03 mg/dL — AB (ref 0.44–1.00)
Chloride: 99 mmol/L — ABNORMAL LOW (ref 101–111)
GFR calc Af Amer: 27 mL/min — ABNORMAL LOW (ref >60)
GFR, EST NON AFRICAN AMERICAN: 23 mL/min — AB (ref >60)
Glucose, Bld: 96 mg/dL (ref 65–99)
POTASSIUM: 2.2 mmol/L — AB (ref 3.5–5.1)
SODIUM: 140 mmol/L (ref 135–145)

## 2015-02-22 LAB — URINALYSIS, ROUTINE W REFLEX MICROSCOPIC
Bilirubin Urine: NEGATIVE
GLUCOSE, UA: NEGATIVE mg/dL
KETONES UR: NEGATIVE mg/dL
LEUKOCYTES UA: NEGATIVE
NITRITE: NEGATIVE
PH: 5.5 (ref 5.0–8.0)
Protein, ur: NEGATIVE mg/dL
SPECIFIC GRAVITY, URINE: 1.008 (ref 1.005–1.030)
Urobilinogen, UA: 0.2 mg/dL (ref 0.0–1.0)

## 2015-02-22 LAB — URINE MICROSCOPIC-ADD ON

## 2015-02-22 LAB — MAGNESIUM: MAGNESIUM: 1.2 mg/dL — AB (ref 1.7–2.4)

## 2015-02-22 LAB — CREATININE, URINE, RANDOM: CREATININE, URINE: 44.18 mg/dL

## 2015-02-22 MED ORDER — CARVEDILOL 6.25 MG PO TABS
6.2500 mg | ORAL_TABLET | Freq: Two times a day (BID) | ORAL | Status: DC
  Administered 2015-02-22 – 2015-02-27 (×10): 6.25 mg via ORAL
  Filled 2015-02-22: qty 1
  Filled 2015-02-22: qty 2
  Filled 2015-02-22 (×8): qty 1

## 2015-02-22 MED ORDER — ENOXAPARIN SODIUM 30 MG/0.3ML ~~LOC~~ SOLN
25.0000 mg | SUBCUTANEOUS | Status: DC
  Administered 2015-02-22 – 2015-02-23 (×2): 25 mg via SUBCUTANEOUS
  Filled 2015-02-22: qty 0.3
  Filled 2015-02-22 (×3): qty 0.25
  Filled 2015-02-22: qty 0.3

## 2015-02-22 MED ORDER — POTASSIUM CHLORIDE CRYS ER 20 MEQ PO TBCR
40.0000 meq | EXTENDED_RELEASE_TABLET | Freq: Two times a day (BID) | ORAL | Status: DC
  Administered 2015-02-22: 20 meq via ORAL
  Filled 2015-02-22: qty 2

## 2015-02-22 MED ORDER — ENOXAPARIN SODIUM 40 MG/0.4ML ~~LOC~~ SOLN: 40.0000 mg | SUBCUTANEOUS | Status: DC

## 2015-02-22 MED ORDER — PANTOPRAZOLE SODIUM 40 MG PO TBEC
40.0000 mg | DELAYED_RELEASE_TABLET | Freq: Every day | ORAL | Status: DC
  Administered 2015-02-23 – 2015-03-01 (×7): 40 mg via ORAL
  Filled 2015-02-22 (×7): qty 1

## 2015-02-22 MED ORDER — POTASSIUM CHLORIDE CRYS ER 20 MEQ PO TBCR
40.0000 meq | EXTENDED_RELEASE_TABLET | Freq: Once | ORAL | Status: AC
  Administered 2015-02-22: 40 meq via ORAL
  Filled 2015-02-22: qty 2

## 2015-02-22 MED ORDER — ACETAMINOPHEN 325 MG PO TABS
650.0000 mg | ORAL_TABLET | Freq: Four times a day (QID) | ORAL | Status: DC | PRN
  Administered 2015-02-23 – 2015-02-24 (×2): 650 mg via ORAL
  Filled 2015-02-22 (×3): qty 2

## 2015-02-22 MED ORDER — HYDROCODONE-ACETAMINOPHEN 5-325 MG PO TABS: 1.0000 | ORAL_TABLET | Freq: Once | ORAL | Status: DC

## 2015-02-22 MED ORDER — POTASSIUM CHLORIDE 10 MEQ/100ML IV SOLN
10.0000 meq | Freq: Once | INTRAVENOUS | Status: AC
  Administered 2015-02-22: 10 meq via INTRAVENOUS
  Filled 2015-02-22: qty 100

## 2015-02-22 MED ORDER — CLONIDINE HCL 0.1 MG PO TABS
0.1000 mg | ORAL_TABLET | Freq: Two times a day (BID) | ORAL | Status: DC
  Administered 2015-02-22 – 2015-02-24 (×4): 0.1 mg via ORAL
  Filled 2015-02-22 (×4): qty 1

## 2015-02-22 MED ORDER — MAGNESIUM SULFATE 2 GM/50ML IV SOLN
2.0000 g | Freq: Once | INTRAVENOUS | Status: AC
  Administered 2015-02-22: 2 g via INTRAVENOUS
  Filled 2015-02-22: qty 50

## 2015-02-22 MED ORDER — DICLOFENAC SODIUM 1 % TD GEL
2.0000 g | Freq: Four times a day (QID) | TRANSDERMAL | Status: DC
Start: 2015-02-22 — End: 2015-03-01
  Administered 2015-02-22 – 2015-02-25 (×10): 2 g via TOPICAL
  Filled 2015-02-22: qty 100

## 2015-02-22 MED ORDER — SODIUM CHLORIDE 0.9 % IV SOLN
INTRAVENOUS | Status: DC
  Administered 2015-02-22: 12:00:00 via INTRAVENOUS

## 2015-02-22 NOTE — ED Provider Notes (Signed)
CSN: [id number]     Arrival date & time 02/22/15  4627 History   First MD Initiated Contact with Patient 02/22/15 [phone]     Chief Complaint  Patient presents with  . Abnormal Lab     (Consider location/radiation/quality/duration/timing/severity/associated sxs/prior Treatment) The history is provided by the patient and medical records.     This is a 74 year old female with history of hypertension, uterine prolapse, cystocele, hyperlipidemia, presenting to the ED for abnormal labs. Patient has recently been undergoing process by her PCP and cardiologist for medical clearance to have hysterectomy and bladder tack on 03/27/2015. She states they've recently noticed some issues with her pressure being too high and have been attempting to adjust her medications over the past several weeks. They've tried her on multiple different medications-- was originally on lisinopril which caused elevated creatinine, then switch to amlodipine which cause peripheral edema, now currently on combination of lower dose lisinopril, amlodipine, HCTZ, carvedilol and PRN clonidine.  Patient states she recently had labs redrawn after they changed her medications, was called this morning and notified to her potassium, calcium, magnesium, and creatinine were abnormal. Patient denies any current chest pain, SOB, abdominal pain, or weakness.  Does have some pain of left thumb.  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  Denies numbness/weakness.  No fever, chills. PCP-- [facility] in [place], [place]   Past Medical History  Diagnosis Date  . Hypertension   . Uterine prolapse   . Hydronephrosis     bilateral  . Esophageal reflux   . Cystocele, midline   . Old myocardial infarction   . Hyperlipidemia    Past Surgical History  Procedure Laterality Date  . Cholecystectomy    . Cataract extraction  2000/2010   Family History  Problem Relation Age of Onset  . Thyroid cancer Mother   .  Heart Problems Father    Social History  Substance Use Topics  . Smoking status: Former Research scientist (life sciences)  . Smokeless tobacco: None  . Alcohol Use: None   OB History    No data available     Review of Systems  Constitutional:       Abnormal labs  All other systems reviewed and are negative.     Allergies  Amlodipine  Home Medications   Prior to Admission medications   Medication Sig Start Date End Date Taking? Authorizing Yevette Knust  carvedilol (COREG) 6.25 MG tablet Take 1 tablet (6.25 mg total) by mouth 2 (two) times daily. 02/02/15   Jettie Booze, MD  cloNIDine (CATAPRES) 0.1 MG tablet Take 1 tablet (0.1 mg total) by mouth 2 (two) times daily. 01/23/15   Isaiah Serge, NP  hydrochlorothiazide (HYDRODIURIL) 25 MG tablet Take 1 tablet (25 mg total) by mouth daily. 01/23/15   Isaiah Serge, NP  IRON PO Take 1 tablet by mouth 2 (two) times daily. 65 mg    Historical Sibel Khurana, MD  lisinopril (PRINIVIL,ZESTRIL) 20 MG tablet Take 1 tablet (20 mg total) by mouth daily. 01/23/15   Isaiah Serge, NP  omeprazole (PRILOSEC) 20 MG capsule Take 1 capsule by mouth daily. 01/09/15   Historical Iyah Laguna, MD  pravastatin (PRAVACHOL) 20 MG tablet Take 20 mg by mouth daily.    Historical Merci Walthers, MD   BP 152/62 mmHg  Pulse 59  Temp(Src) 98.7 F (37.1 C) (Oral)  Resp 16  Ht 4\' 7"  (1.397 m)  Wt 115 lb (52.164 kg)  BMI 26.73 kg/m2  SpO2 97%  Physical Exam  Constitutional: She is oriented to person, place, and time. She appears well-developed and well-nourished. No distress.  HENT:  Head: Normocephalic and atraumatic.  Mouth/Throat: Oropharynx is clear and moist.  Eyes: Conjunctivae and EOM are normal. Pupils are equal, round, and reactive to light.  Neck: Normal range of motion. Neck supple.  Cardiovascular: Normal rate, regular rhythm and normal heart sounds.   Pulmonary/Chest: Effort normal and breath sounds normal. No respiratory distress. She has no wheezes.  Abdominal: Soft. Bowel  sounds are normal. There is no tenderness. There is no guarding.  Musculoskeletal: Normal range of motion. She exhibits no edema.  Slight swelling noted to left thumb along with warmth to touch; no overlying erythema or cellulitis; full ROM of thumb without difficulty although some pain noted; hand is NVI 2+ peripheral edema bilaterally  Neurological: She is alert and oriented to person, place, and time.  Skin: Skin is warm and dry. She is not diaphoretic.  Psychiatric: She has a normal mood and affect.  Nursing note and vitals reviewed.   ED Course  Procedures (including critical care time) Labs Review Labs Reviewed  CBC WITH DIFFERENTIAL/PLATELET - Abnormal; Notable for the following:    RBC 3.30 (*)    Hemoglobin 9.5 (*)    HCT 27.5 (*)    All other components within normal limits  BASIC METABOLIC PANEL - Abnormal; Notable for the following:    Potassium 2.2 (*)    Chloride 99 (*)    BUN 31 (*)    Creatinine, Ser 2.03 (*)    Calcium 7.7 (*)    GFR calc non Af Amer 23 (*)    GFR calc Af Amer 27 (*)    All other components within normal limits  MAGNESIUM - Abnormal; Notable for the following:    Magnesium 1.2 (*)    All other components within normal limits    Imaging Review No results found.   ED ECG REPORT   Date: 02/22/2015  Rate: 85  Rhythm: normal sinus rhythm  QRS Axis: normal  Intervals: normal  ST/T Wave abnormalities: nonspecific ST/T changes  Conduction Disutrbances:none  Narrative Interpretation:   Old EKG Reviewed: unchanged  I have personally reviewed the EKG tracing and agree with the computerized printout as noted.  I have personally reviewed and evaluated these images and lab results as part of my medical decision-making.   EKG Interpretation None      MDM   Final diagnoses:  Acute kidney injury  Hypokalemia   74 year old female here for abnormal labs done at cardiology office.  Recent adjustments to her BP meds for pre-op clearance.   VSS on arrival.  Repeat labs today with noted AKI and hypokalemia which may be due to changes in meds, notably HCTZ contributing to hypokalemia.  Magnesium also sent which was low at 1.2.  Left thumb pain may be related to gout.  Lower suspicion for acute fracture without known injury.  No signs/sx concerning for septic joint.  Patient given IVF, IV and PO potassium supplementation and IV Magnesium.  Patient admitted to IM teaching service for further management.  1:41 PM Notified that patient became hypoxic after ambulating to restroom.  Patient did not have any CP/SOB initially on arrival to ED.  Patient placed on 2L O2 with improvement of her saturation.  Will obtain CXR.  Admitting team notified.    Larene Pickett, PA-C 02/22/15 1421  Orpah Greek, MD 02/22/15 [phone]

## 2015-02-22 NOTE — ED Notes (Addendum)
Pt desated while ambulating to restroom to 76%. Pt placed on 2L [place], O2 improved to 96%. Pt was also experiencing SOB and wheezing while ambulating. Pt walked with steady gait. EDP aware.

## 2015-02-22 NOTE — Progress Notes (Signed)
Pt c/o 8 out of 10 pain.  Pt states that it burns intensely when she urinates, and urine is leaking from the catheter site as well.  Notified provider who will be coming to round on patient soon...awaiting orders. Will continue to monitor pt.

## 2015-02-22 NOTE — ED Provider Notes (Signed)
Patient presented to the ER with abnormal labs. Patient reports that she had preop labs for hysterectomy drawn by her doctor and was called and told that her kidney function was abnormal and her potassium was low.  Patient reports that she has had diffuse swelling. Her doctor has been addressing her elevated blood pressure with changes in her blood pressure medications including diaphoretic.  Face to face Exam: HEENT - PERRLA Lungs - CTAB Heart - RRR, no M/R/G Abd - S/NT/ND Neuro - alert, oriented x3  Plan: Patient with acute kidney injury and profound hypokalemia. She will require hospitalization for further management.  Orpah Greek, MD 02/22/15 1146

## 2015-02-22 NOTE — ED Notes (Signed)
Attempted report 

## 2015-02-22 NOTE — ED Notes (Signed)
Per pt and family, pt doctors office told pt to come to the hospital d/t abnormal labs (potassium, calcium, magnesium, and kidney function.) Pt supposed to have surgery  Have hysterectomy and bladder tact on November 1st.

## 2015-02-22 NOTE — H&P (Signed)
Date: 02/22/2015               Patient Name:  Mariah Phillips MRN: [id number]  DOB: 1940/06/28 Age / Sex: 74 y.o., female   PCP: Provider Not In System         Medical Service: Internal Medicine Teaching Service         Attending Physician: Dr. Carlyle Basques, MD    First Contact: Dr. Benjamine Mola Pager: [phone]  Second Contact: Dr. Genene Churn Pager: [phone]       After Hours (After 5p/  First Contact Pager: [phone]  weekends / holidays): Second Contact Pager: [phone]   Chief Complaint: Hypokalemia  History of Present Illness: 74 year old woman with PMHx of MI in 1984, hypertension, hyperlipidemia, cystocele, uterine prolapse presenting to the ED after a preoperative visit with her cardiologist before a planned hysterectomy and bladder tack on 03/27/2015, where her potassium was noted to be 2.2. She has recently changed medications several times due to very resistant hypertension, most recently on lower dose of lisinopril and higher dose of clonidine. She reports having easy fatigability, increased leg swelling bilaterally, and intermittent tingling sensation in both arms. She has urinary retention and stress incontinence which are longstanding due to uterine prolapse. Otherwise she is feeling well with no decreased appetite, nausea, vomiting, or diarrhea.  After arrival to the ED she reports becoming dyspneic walking to the bathroom and back, and was noted on pulse oximetry to desaturate to 80%. She was placed on 3 L oxygen by nasal cannula and after returning to bed was in no distress. 2 view chest x-ray was obtained demonstrating diffuse mild pulmonary edema. Renal ultrasound was obtained demonstrating severe bilateral hydronephrosis. EKG was obtained showing normal sinus rhythm. Patient received 40 mEq KCl PO in ED.  Meds: Current Facility-Administered Medications  Medication Dose Route Frequency Provider Last Rate Last Dose  . 0.9 %  sodium chloride infusion   Intravenous Continuous Larene Pickett,  PA-C   Stopped at 02/22/15 1321  . carvedilol (COREG) tablet 6.25 mg  6.25 mg Oral BID Tasrif Ahmed, MD      . cloNIDine (CATAPRES) tablet 0.1 mg  0.1 mg Oral BID Tasrif Ahmed, MD      . enoxaparin (LOVENOX) injection 25 mg  25 mg Subcutaneous Q24H [employer], RPH      . [START ON 02/23/2015] pantoprazole (PROTONIX) EC tablet 40 mg  40 mg Oral Daily Dellia Nims, MD       Allergies: Allergies as of 02/22/2015 - Review Complete 02/22/2015  Allergen Reaction Noted  . Amlodipine Swelling 01/17/2015   Past Medical History  Diagnosis Date  . Hypertension   . Uterine prolapse   . Hydronephrosis     bilateral  . Esophageal reflux   . Cystocele, midline   . Old myocardial infarction   . Hyperlipidemia    Past Surgical History  Procedure Laterality Date  . Cholecystectomy    . Cataract extraction  2000/2010   Family History  Problem Relation Age of Onset  . Thyroid cancer Mother   . Heart Problems Father    Social History   Social History  . Marital Status: Widowed    Spouse Name: N/A  . Number of Children: N/A  . Years of Education: N/A   Occupational History  . Not on file.   Social History Main Topics  . Smoking status: Former Research scientist (life sciences)  . Smokeless tobacco: Not on file  . Alcohol Use: Not on file  .  Drug Use: Not on file  . Sexual Activity: Not on file   Other Topics Concern  . Not on file   Social History Narrative   Review of Systems: Review of Systems  Constitutional: Negative for fever and chills.  Eyes: Negative for double vision.  Respiratory: Positive for shortness of breath.   Cardiovascular: Positive for leg swelling. Negative for chest pain and palpitations.  Gastrointestinal: Negative for nausea, vomiting, abdominal pain, diarrhea and constipation.  Genitourinary:       Retention, incontinence, urgency  Musculoskeletal: Positive for joint pain.  Skin: Negative for rash.  Neurological: Positive for weakness. Negative for dizziness and headaches.    Endo/Heme/Allergies: Negative for polydipsia.  Psychiatric/Behavioral: The patient is not nervous/anxious.    Physical Exam: Blood pressure 158/64, pulse 75, temperature 98.7 F (37.1 C), temperature source Oral, resp. rate 18, height 4\' 7"  (1.397 m), weight 52.4 kg (115 lb 8.3 oz), SpO2 97 %.   GENERAL- alert, very pelasant, NAD HEENT- Atraumatic, PERRL, oral mucosa appears moist, good and intact dentition. No carotid bruit, no cervical LN enlargement  CARDIAC- RRR, 2/6 early systolic murmur audible at LUSB RESP- Breath sounds distant, poor air movement bilaterally ABDOMEN- Soft, nontender, no guarding or rebound, normoactive bowel sounds present BACK- Advanced kyphosis of thoracic spine, no paraspinal tenderness, no CVA tenderness NEURO- No obvious Cr N abnormality, strength upper and lower extremities- 5/5 EXTREMITIES- 2+ pedal edema bilaterally, Left 1st MCP swollen, TTP and ROM, no erythema or warmth SKIN- Warm, dry, No rash or lesion PSYCH- Normal mood and affect, appropriate thought content and speech  Lab results: Basic Metabolic Panel:  Recent Labs  02/22/15 1043 02/22/15 1134  NA 140  --   K 2.2*  --   CL 99*  --   CO2 28  --   GLUCOSE 96  --   BUN 31*  --   CREATININE 2.03*  --   CALCIUM 7.7*  --   MG  --  1.2*   Liver Function Tests: No results for input(s): AST, ALT, ALKPHOS, BILITOT, PROT, ALBUMIN in the last 72 hours. No results for input(s): LIPASE, AMYLASE in the last 72 hours. No results for input(s): AMMONIA in the last 72 hours. CBC:  Recent Labs  02/22/15 1043  WBC 8.0  NEUTROABS 5.8  HGB 9.5*  HCT 27.5*  MCV 83.3  PLT 214   Cardiac Enzymes: No results for input(s): CKTOTAL, CKMB, CKMBINDEX, TROPONINI in the last 72 hours. BNP: No results for input(s): PROBNP in the last 72 hours. D-Dimer: No results for input(s): DDIMER in the last 72 hours. CBG: No results for input(s): GLUCAP in the last 72 hours. Hemoglobin A1C: No results for  input(s): HGBA1C in the last 72 hours. Fasting Lipid Panel: No results for input(s): CHOL, HDL, LDLCALC, TRIG, CHOLHDL, LDLDIRECT in the last 72 hours. Thyroid Function Tests: No results for input(s): TSH, T4TOTAL, FREET4, T3FREE, THYROIDAB in the last 72 hours. Anemia Panel: No results for input(s): VITAMINB12, FOLATE, FERRITIN, TIBC, IRON, RETICCTPCT in the last 72 hours. Coagulation: No results for input(s): LABPROT, INR in the last 72 hours. Urine Drug Screen: Drugs of Abuse  No results found for: LABOPIA, COCAINSCRNUR, LABBENZ, AMPHETMU, THCU, LABBARB  Alcohol Level: No results for input(s): ETH in the last 72 hours. Urinalysis: No results for input(s): COLORURINE, LABSPEC, PHURINE, GLUCOSEU, HGBUR, BILIRUBINUR, KETONESUR, PROTEINUR, UROBILINOGEN, NITRITE, LEUKOCYTESUR in the last 72 hours.  Invalid input(s): APPERANCEUR  Imaging results:  Dg Chest 2 View  02/22/2015  CLINICAL DATA:  Shortness of breath today.  EXAM: CHEST  2 VIEW  COMPARISON:  Single view of the chest 10/29/2007.  FINDINGS: There is cardiomegaly and mild interstitial edema. Kerley B-lines are noted. Hiatal hernia is seen. No pneumothorax or pleural effusion.  IMPRESSION: Cardiomegaly and interstitial pulmonary edema.  Hiatal hernia.   Electronically Signed   By: Inge Rise M.D.   On: 02/22/2015 14:58   US Renal  02/22/2015   CLINICAL DATA:  Abnormal renal function tests.  Hypoproteinemia.  EXAM: RENAL / URINARY TRACT ULTRASOUND COMPLETE  COMPARISON:  CT abdomen and pelvis 12/22/2014.  FINDINGS: Right Kidney:  Length: 9.5 cm. Echogenicity within normal limits. There is severe hydronephrosis as seen on the comparison examination. No mass visualized.  Left Kidney:  Length: 11.1 cm. Echogenicity within normal limits. There is severe hydronephrosis is seen on the comparison examination. No mass visualized.  Bladder:  Appears normal for degree of bladder distention. Ureteral jets are not identified.  IMPRESSION:  Persistent severe bilateral hydronephrosis. Cause for obstruction is not identified. Prior CT scan raised the possibility of obstruction secondary to pelvic floor laxity.   Electronically Signed   By: Inge Rise M.D.   On: 02/22/2015 15:46   Other results: EKG: normal EKG, normal sinus rhythm, unchanged from previous tracings.  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 She has been followed in symptomatically this since February of this year, with surgery scheduled for 03/27/2015. However she shows acute worsening of renal function from approximately 1.3-1.4 in August to 2.03 today. Renal ultrasound demonstrated bilateral hydronephrosis with kidney sizes 9.5 cm on right and 11.1 cm on left with moderate bladder distention. There may be a component of chronic nephropathy due to the prolonged obstructive disease but we expect significant improvement due to the acute worsening over last month. -Foley catheter -Strict I/Os -Follow up qAM BMP  Hypertension Difficult to control blood pressure on multiple medication treatments. Possibly this is being driven by her renal obstructions.  -Hold lisinopril and hydrochlorothiazide due to hypokalemia  Hypokalemia 2.2 on admission, last measurements 3.5-3.8 at outpatient clinic checks. No medications were changed that would explain this level hypokalemia. Lisinopril was actually decreased on a previous clinic visit. May be related to over activation of the RAAS pathway due to decreased perfusion with bilateral obstructions, versus hyperaldosteronism, or may be due to not yet recognized medication cause. -KCl by mouth 40 mEq twice a day -Replace magnesium IV as needed  GERD With hiatal hernia. No history of esophagitis or gastritis. -Protonix 40 mg by mouth  Diet: Regular DVT ppx: [place] lovenox renal dose FULL CODE  Dispo: Disposition is deferred at this time,  awaiting improvement of current medical problems. Anticipated discharge in approximately 3-5 day(s).   The patient does not know have a current PCP (Provider Not In System) and does not know need an [facility] hospital follow-up appointment after discharge.  The patient does not know have transportation limitations that hinder transportation to clinic appointments.  Signed: Collier Salina, MD 02/22/2015, 5:04 PM

## 2015-02-23 ENCOUNTER — Inpatient Hospital Stay (HOSPITAL_COMMUNITY): Payer: BLUE CROSS/BLUE SHIELD

## 2015-02-23 DIAGNOSIS — K449 Diaphragmatic hernia without obstruction or gangrene: Secondary | ICD-10-CM

## 2015-02-23 DIAGNOSIS — N1330 Unspecified hydronephrosis: Secondary | ICD-10-CM | POA: Diagnosis present

## 2015-02-23 DIAGNOSIS — N139 Obstructive and reflux uropathy, unspecified: Secondary | ICD-10-CM | POA: Diagnosis present

## 2015-02-23 DIAGNOSIS — M79645 Pain in left finger(s): Secondary | ICD-10-CM

## 2015-02-23 DIAGNOSIS — E876 Hypokalemia: Secondary | ICD-10-CM

## 2015-02-23 DIAGNOSIS — R339 Retention of urine, unspecified: Secondary | ICD-10-CM

## 2015-02-23 DIAGNOSIS — Z960 Presence of urogenital implants: Secondary | ICD-10-CM

## 2015-02-23 DIAGNOSIS — I10 Essential (primary) hypertension: Secondary | ICD-10-CM

## 2015-02-23 DIAGNOSIS — K219 Gastro-esophageal reflux disease without esophagitis: Secondary | ICD-10-CM

## 2015-02-23 DIAGNOSIS — R0602 Shortness of breath: Secondary | ICD-10-CM

## 2015-02-23 DIAGNOSIS — N814 Uterovaginal prolapse, unspecified: Secondary | ICD-10-CM

## 2015-02-23 DIAGNOSIS — N138 Other obstructive and reflux uropathy: Secondary | ICD-10-CM

## 2015-02-23 LAB — CBC WITH DIFFERENTIAL/PLATELET
Basophils Absolute: 0 10*3/uL (ref 0.0–0.1)
Basophils Relative: 0 %
EOS ABS: 0.1 10*3/uL (ref 0.0–0.7)
EOS PCT: 2 %
HCT: 24.3 % — ABNORMAL LOW (ref 36.0–46.0)
Hemoglobin: 8.3 g/dL — ABNORMAL LOW (ref 12.0–15.0)
LYMPHS ABS: 1.3 10*3/uL (ref 0.7–4.0)
Lymphocytes Relative: 21 %
MCH: 28.8 pg (ref 26.0–34.0)
MCHC: 34.2 g/dL (ref 30.0–36.0)
MCV: 84.4 fL (ref 78.0–100.0)
MONO ABS: 0.5 10*3/uL (ref 0.1–1.0)
MONOS PCT: 9 %
Neutro Abs: 4.1 10*3/uL (ref 1.7–7.7)
Neutrophils Relative %: 68 %
PLATELETS: 200 10*3/uL (ref 150–400)
RBC: 2.88 MIL/uL — AB (ref 3.87–5.11)
RDW: 14 % (ref 11.5–15.5)
WBC: 6 10*3/uL (ref 4.0–10.5)

## 2015-02-23 LAB — MAGNESIUM
MAGNESIUM: 1.8 mg/dL (ref 1.7–2.4)
Magnesium: 2.2 mg/dL (ref 1.7–2.4)

## 2015-02-23 LAB — BASIC METABOLIC PANEL
Anion gap: 9 (ref 5–15)
BUN: 26 mg/dL — AB (ref 6–20)
CHLORIDE: 104 mmol/L (ref 101–111)
CO2: 29 mmol/L (ref 22–32)
CREATININE: 1.85 mg/dL — AB (ref 0.44–1.00)
Calcium: 7.4 mg/dL — ABNORMAL LOW (ref 8.9–10.3)
GFR calc Af Amer: 30 mL/min — ABNORMAL LOW (ref >60)
GFR calc non Af Amer: 26 mL/min — ABNORMAL LOW (ref >60)
GLUCOSE: 114 mg/dL — AB (ref 65–99)
Potassium: 2.2 mmol/L — CL (ref 3.5–5.1)
SODIUM: 142 mmol/L (ref 135–145)

## 2015-02-23 LAB — RENAL FUNCTION PANEL
Albumin: 2.8 g/dL — ABNORMAL LOW (ref 3.5–5.0)
Anion gap: 9 (ref 5–15)
BUN: 21 mg/dL — AB (ref 6–20)
CHLORIDE: 100 mmol/L — AB (ref 101–111)
CO2: 28 mmol/L (ref 22–32)
CREATININE: 1.61 mg/dL — AB (ref 0.44–1.00)
Calcium: 7.8 mg/dL — ABNORMAL LOW (ref 8.9–10.3)
GFR calc Af Amer: 36 mL/min — ABNORMAL LOW (ref >60)
GFR calc non Af Amer: 31 mL/min — ABNORMAL LOW (ref >60)
GLUCOSE: 156 mg/dL — AB (ref 65–99)
POTASSIUM: 3.3 mmol/L — AB (ref 3.5–5.1)
Phosphorus: 1.8 mg/dL — ABNORMAL LOW (ref 2.5–4.6)
Sodium: 137 mmol/L (ref 135–145)

## 2015-02-23 LAB — POTASSIUM, URINE RANDOM: POTASSIUM UR: 17 mmol/L

## 2015-02-23 MED ORDER — FUROSEMIDE 20 MG PO TABS
20.0000 mg | ORAL_TABLET | Freq: Once | ORAL | Status: AC
  Administered 2015-02-23: 20 mg via ORAL
  Filled 2015-02-23: qty 1

## 2015-02-23 MED ORDER — COLCHICINE 0.6 MG PO TABS
0.3000 mg | ORAL_TABLET | Freq: Every day | ORAL | Status: DC
  Administered 2015-02-23 – 2015-03-01 (×7): 0.3 mg via ORAL
  Filled 2015-02-23 (×7): qty 1

## 2015-02-23 MED ORDER — COLCHICINE 0.6 MG PO TABS: 0.6000 mg | ORAL_TABLET | Freq: Every day | ORAL | Status: DC

## 2015-02-23 MED ORDER — IPRATROPIUM-ALBUTEROL 0.5-2.5 (3) MG/3ML IN SOLN: 3.0000 mL | Freq: Once | RESPIRATORY_TRACT | Status: DC

## 2015-02-23 MED ORDER — POTASSIUM CHLORIDE CRYS ER 20 MEQ PO TBCR
40.0000 meq | EXTENDED_RELEASE_TABLET | Freq: Two times a day (BID) | ORAL | Status: DC
  Administered 2015-02-23: 40 meq via ORAL
  Filled 2015-02-23: qty 2

## 2015-02-23 MED ORDER — POTASSIUM CHLORIDE CRYS ER 20 MEQ PO TBCR
40.0000 meq | EXTENDED_RELEASE_TABLET | Freq: Three times a day (TID) | ORAL | Status: DC
  Administered 2015-02-23 – 2015-02-24 (×5): 40 meq via ORAL
  Filled 2015-02-23 (×5): qty 2

## 2015-02-23 MED ORDER — MAGNESIUM SULFATE 2 GM/50ML IV SOLN
2.0000 g | Freq: Once | INTRAVENOUS | Status: AC
  Administered 2015-02-23: 2 g via INTRAVENOUS
  Filled 2015-02-23: qty 50

## 2015-02-23 MED ORDER — IPRATROPIUM-ALBUTEROL 0.5-2.5 (3) MG/3ML IN SOLN
3.0000 mL | Freq: Once | RESPIRATORY_TRACT | Status: AC
  Administered 2015-02-23: 3 mL via RESPIRATORY_TRACT

## 2015-02-23 NOTE — Progress Notes (Signed)
Utilization Review Completed.Donne Anon T9/30/2016

## 2015-02-23 NOTE — Progress Notes (Signed)
Subjective: Patient resting comfortably today, good UOP via foley catheter. Became short of breath when oxygen requirement was challenged, placed back to 2L O2 by [place]. Her left thumb pain was only partially improved with tylenol and diclofenac gel.  Objective: Vital signs in last 24 hours: Filed Vitals:   02/23/15 0500 02/23/15 0540 02/23/15 1000 02/23/15 1714  BP:  164/43 147/50 169/61  Pulse:  66 75 71  Temp:  98.7 F (37.1 C) 99.2 F (37.3 C) 98.7 F (37.1 C)  TempSrc:  Oral Oral Oral  Resp:  19 20 20   Height:      Weight: 50.8 kg (111 lb 15.9 oz)     SpO2:  99% 98% 99%   Weight change:   Intake/Output Summary (Last 24 hours) at 02/23/15 1715 Last data filed at 02/23/15 1300  Gross per 24 hour  Intake    480 ml  Output   1375 ml  Net   -895 ml   GENERAL- alert, very pelasant, NAD HEENT- Atraumatic, PERRL, oral mucosa appears moist, good and intact dentition. No carotid bruit, no cervical LN enlargement  CARDIAC- RRR, 2/6 early systolic murmur audible at LUSB RESP- Breath sounds distant, faint inspiratory crackles present bibasilar ABDOMEN- Soft, nontender, no guarding or rebound, normoactive bowel sounds present NEURO- No obvious Cr N abnormality, strength upper and lower extremities- 5/5 EXTREMITIES- 2+ pedal edema bilaterally, Left 1st MCP warm, swollen, TTP and ROM, no erythema SKIN- Warm, dry, No rash or lesion PSYCH- Normal mood and affect, appropriate thought content and speech  Lab Results: Basic Metabolic Panel:  Recent Labs Lab 02/22/15 1043 02/22/15 1134 02/23/15 0506  NA 140  --  142  K 2.2*  --  2.2*  CL 99*  --  104  CO2 28  --  29  GLUCOSE 96  --  114*  BUN 31*  --  26*  CREATININE 2.03*  --  1.85*  CALCIUM 7.7*  --  7.4*  MG  --  1.2* 1.8   Liver Function Tests: No results for input(s): AST, ALT, ALKPHOS, BILITOT, PROT, ALBUMIN in the last 168 hours. No results for input(s): LIPASE, AMYLASE in the last 168 hours. No results for  input(s): AMMONIA in the last 168 hours. CBC:  Recent Labs Lab 02/22/15 1043 02/22/15 1810 02/23/15 0506  WBC 8.0 9.8 6.0  NEUTROABS 5.8  --  4.1  HGB 9.5* 9.6* 8.3*  HCT 27.5* 28.1* 24.3*  MCV 83.3 84.9 84.4  PLT 214 258 200   Cardiac Enzymes: No results for input(s): CKTOTAL, CKMB, CKMBINDEX, TROPONINI in the last 168 hours. BNP: No results for input(s): PROBNP in the last 168 hours. D-Dimer: No results for input(s): DDIMER in the last 168 hours. CBG: No results for input(s): GLUCAP in the last 168 hours. Hemoglobin A1C: No results for input(s): HGBA1C in the last 168 hours. Fasting Lipid Panel: No results for input(s): CHOL, HDL, LDLCALC, TRIG, CHOLHDL, LDLDIRECT in the last 168 hours. Thyroid Function Tests: No results for input(s): TSH, T4TOTAL, FREET4, T3FREE, THYROIDAB in the last 168 hours. Coagulation: No results for input(s): LABPROT, INR in the last 168 hours. Anemia Panel: No results for input(s): VITAMINB12, FOLATE, FERRITIN, TIBC, IRON, RETICCTPCT in the last 168 hours. Urine Drug Screen: Drugs of Abuse  No results found for: LABOPIA, COCAINSCRNUR, LABBENZ, AMPHETMU, THCU, LABBARB  Alcohol Level: No results for input(s): ETH in the last 168 hours. Urinalysis:  Recent Labs Lab 02/22/15 [place]  LABSPEC 1.008  PHURINE  5.5  GLUCOSEU NEGATIVE  HGBUR SMALL*  BILIRUBINUR NEGATIVE  KETONESUR NEGATIVE  PROTEINUR NEGATIVE  UROBILINOGEN 0.2  NITRITE NEGATIVE  LEUKOCYTESUR NEGATIVE   Micro Results: No results found for this or any previous visit (from the past 240 hour(s)). Studies/Results: Dg Chest 2 View  02/22/2015   CLINICAL DATA:  Shortness of breath today.  EXAM: CHEST  2 VIEW  COMPARISON:  Single view of the chest 10/29/2007.  FINDINGS: There is cardiomegaly and mild interstitial edema. Kerley B-lines are noted. Hiatal hernia is seen. No pneumothorax or pleural effusion.  IMPRESSION: Cardiomegaly and interstitial pulmonary edema.   Hiatal hernia.   Electronically Signed   By: Inge Rise M.D.   On: 02/22/2015 14:58   US Renal  02/22/2015   CLINICAL DATA:  Abnormal renal function tests.  Hypoproteinemia.  EXAM: RENAL / URINARY TRACT ULTRASOUND COMPLETE  COMPARISON:  CT abdomen and pelvis 12/22/2014.  FINDINGS: Right Kidney:  Length: 9.5 cm. Echogenicity within normal limits. There is severe hydronephrosis as seen on the comparison examination. No mass visualized.  Left Kidney:  Length: 11.1 cm. Echogenicity within normal limits. There is severe hydronephrosis is seen on the comparison examination. No mass visualized.  Bladder:  Appears normal for degree of bladder distention. Ureteral jets are not identified.  IMPRESSION: Persistent severe bilateral hydronephrosis. Cause for obstruction is not identified. Prior CT scan raised the possibility of obstruction secondary to pelvic floor laxity.   Electronically Signed   By: Inge Rise M.D.   On: 02/22/2015 15:46   Medications: I have reviewed the patient's current medications. Scheduled Meds: . carvedilol  6.25 mg Oral BID  . cloNIDine  0.1 mg Oral BID  . diclofenac sodium  2 g Topical QID  . enoxaparin (LOVENOX) injection  25 mg Subcutaneous Q24H  . HYDROcodone-acetaminophen  1 tablet Oral Once  . pantoprazole  40 mg Oral Daily  . 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 Difficult to place, with some leaking. Will definitely require home health nursing assistance to manage this if continued after discharge. Discussed case briefly with Urology service, F/U appointment with outpatient clinic 10/11 1pm was provided with Dr. Anders Grant (sp?) and current plan to correct metabolic dysfunction and discharge with foley catheter. OBGYN surgeon Dr. Garwin Brothers not yet contacted bout case. -Strict  I/Os -Follow up qAM BMP - Discuss surgical plans as needed with Urology/OBGYN services  Hypertension Difficult to control blood pressure on multiple medication treatments. Probably driven by her renal obstructions.  -Hold lisinopril and hydrochlorothiazide due to hypokalemia  Hypokalemia 2.2 on repeat Bmet today. Received 44mEq total potassium by that time. Replaced magnesium 1.8 today AM.  -KCl 40 mEq PO TID -Replace Mg IV as needed -Repeat Bmet today 6pm -Repeat Mg  GERD With hiatal hernia. No history of esophagitis or gastritis. -Protonix 40 mg by mouth  Left thumb pain Probable gout in the setting of AKI. - Acetaminophen 650mg  q6hrs PRN - Diclofenac gel q6hrs PRN - Start colchicine 0.3mg  today for severe renal impairment  Diet: Regular DVT ppx: [place] lovenox renal dose FULL CODE  Dispo: Disposition is deferred at this time, awaiting improvement of current medical problems. Anticipated discharge in approximately 2-4 day(s).   The patient does not know have a current PCP (Provider Not In System) and does not know need an [facility] hospital follow-up appointment after discharge.  The patient  does not know have transportation limitations that hinder transportation to clinic appointments.   LOS: 1 day   Collier Salina, MD 02/23/2015, 5:15 PM

## 2015-02-23 NOTE — Progress Notes (Signed)
Called due to pain on urination and urine leaking from catheter site. Dr. Hulen Luster and I went to evaluate the patient. She was resting comfortably in bed with granddaughter at bedside. Granddaughter expressed frustration that her grandmother, Mariah Phillips, had "all the things done to her and all these problems when all we need to do is her surgery." The granddaughter indicated that we should be be doing whatever is necessary to have her surgery for the uterine prolapse earlier.  I spoke with her nurse, Cheron Schaumann, who indicated that her urine output had been adequate and had already filled at least one bag. Mariah Phillips also recommended against foley repositioning, since an experienced nurse spent >30 minutes placing that foley. She was worried it would be exceedingly difficult to replace it.  Mariah Phillips appeared comfortable, interactive, and not in any distress. No tenderness on palpation of abdomen. She was afebrile with stable vital signs.  Assessment Her pain is likely related to foley placement. However I am concerned repositioning would risk losing the foley and worsen her obstructive nephropathy.  Plan - Will keep foley in place - Absorbent material under patient for leakage - One time vicodin 5/325 for pain

## 2015-02-23 NOTE — Progress Notes (Signed)
Upon entering room, pt noticeably SOB with sats reading 99% on 2 L and res 22. Pt has some rhonchi and wheezing throughout. Pt states that she feels worse than she did when she was admitted. MD notified. Awaiting orders. Will continue to monitor.  Tyna Jaksch, RN

## 2015-02-23 NOTE — Progress Notes (Signed)
Attempted to wean pt's O2 from 2 L to 1 L while she was still in bed. Pt's sats stayed above 95% however pt complained and physically appeared to be SOB. Increased O2 back to 2 L and pt felt better. Will continue to monitor.

## 2015-02-23 NOTE — Progress Notes (Signed)
Critical lab value on potassium 2.2.Marland KitchenMarland KitchenMarland Kitchencontacted provider

## 2015-02-24 ENCOUNTER — Inpatient Hospital Stay (HOSPITAL_COMMUNITY): Payer: BLUE CROSS/BLUE SHIELD

## 2015-02-24 DIAGNOSIS — R0600 Dyspnea, unspecified: Secondary | ICD-10-CM

## 2015-02-24 DIAGNOSIS — N3289 Other specified disorders of bladder: Secondary | ICD-10-CM

## 2015-02-24 DIAGNOSIS — R0989 Other specified symptoms and signs involving the circulatory and respiratory systems: Secondary | ICD-10-CM | POA: Diagnosis present

## 2015-02-24 DIAGNOSIS — R509 Fever, unspecified: Secondary | ICD-10-CM

## 2015-02-24 LAB — RENAL FUNCTION PANEL
ALBUMIN: 2.8 g/dL — AB (ref 3.5–5.0)
ANION GAP: 8 (ref 5–15)
BUN: 20 mg/dL (ref 6–20)
CO2: 28 mmol/L (ref 22–32)
Calcium: 8.4 mg/dL — ABNORMAL LOW (ref 8.9–10.3)
Chloride: 104 mmol/L (ref 101–111)
Creatinine, Ser: 1.6 mg/dL — ABNORMAL HIGH (ref 0.44–1.00)
GFR, EST AFRICAN AMERICAN: 36 mL/min — AB (ref >60)
GFR, EST NON AFRICAN AMERICAN: 31 mL/min — AB (ref >60)
Glucose, Bld: 122 mg/dL — ABNORMAL HIGH (ref 65–99)
PHOSPHORUS: 2.4 mg/dL — AB (ref 2.5–4.6)
POTASSIUM: 3.9 mmol/L (ref 3.5–5.1)
Sodium: 140 mmol/L (ref 135–145)

## 2015-02-24 LAB — BLOOD GAS, ARTERIAL
ACID-BASE EXCESS: 0.3 mmol/L (ref 0.0–2.0)
Bicarbonate: 24.5 mEq/L — ABNORMAL HIGH (ref 20.0–24.0)
DRAWN BY: 283381
O2 CONTENT: 1.5 L/min
O2 SAT: 98.3 %
PCO2 ART: 41 mmHg (ref 35.0–45.0)
Patient temperature: 98.6
TCO2: 25.8 mmol/L (ref 0–100)
pH, Arterial: 7.395 (ref 7.350–7.450)
pO2, Arterial: 111 mmHg — ABNORMAL HIGH (ref 80.0–100.0)

## 2015-02-24 LAB — BRAIN NATRIURETIC PEPTIDE: B NATRIURETIC PEPTIDE 5: 400.4 pg/mL — AB (ref 0.0–100.0)

## 2015-02-24 LAB — CBC
HCT: 29.1 % — ABNORMAL LOW (ref 36.0–46.0)
Hemoglobin: 9.7 g/dL — ABNORMAL LOW (ref 12.0–15.0)
MCH: 28.8 pg (ref 26.0–34.0)
MCHC: 33.3 g/dL (ref 30.0–36.0)
MCV: 86.4 fL (ref 78.0–100.0)
PLATELETS: 210 10*3/uL (ref 150–400)
RBC: 3.37 MIL/uL — AB (ref 3.87–5.11)
RDW: 14 % (ref 11.5–15.5)
WBC: 7.5 10*3/uL (ref 4.0–10.5)

## 2015-02-24 MED ORDER — FUROSEMIDE 10 MG/ML IJ SOLN
20.0000 mg | Freq: Once | INTRAMUSCULAR | Status: AC
  Administered 2015-02-24: 20 mg via INTRAVENOUS
  Filled 2015-02-24: qty 2

## 2015-02-24 MED ORDER — POTASSIUM CHLORIDE CRYS ER 20 MEQ PO TBCR
40.0000 meq | EXTENDED_RELEASE_TABLET | Freq: Two times a day (BID) | ORAL | Status: DC
  Administered 2015-02-25 (×2): 40 meq via ORAL
  Filled 2015-02-24 (×3): qty 2

## 2015-02-24 MED ORDER — ENOXAPARIN SODIUM 30 MG/0.3ML ~~LOC~~ SOLN
25.0000 mg | SUBCUTANEOUS | Status: AC
  Administered 2015-02-24: 25 mg via SUBCUTANEOUS
  Filled 2015-02-24: qty 0.3

## 2015-02-24 MED ORDER — DEXTROSE 5 % IV SOLN
1.0000 g | INTRAVENOUS | Status: DC
  Administered 2015-02-24 – 2015-02-28 (×5): 1 g via INTRAVENOUS
  Filled 2015-02-24 (×6): qty 10

## 2015-02-24 MED ORDER — OXYBUTYNIN CHLORIDE 5 MG PO TABS
2.5000 mg | ORAL_TABLET | Freq: Two times a day (BID) | ORAL | Status: DC
  Administered 2015-02-24 – 2015-03-01 (×10): 2.5 mg via ORAL
  Filled 2015-02-24 (×10): qty 1

## 2015-02-24 MED ORDER — ENOXAPARIN SODIUM 30 MG/0.3ML ~~LOC~~ SOLN
30.0000 mg | SUBCUTANEOUS | Status: DC
  Administered 2015-02-25 – 2015-02-28 (×4): 30 mg via SUBCUTANEOUS
  Filled 2015-02-24 (×4): qty 0.3

## 2015-02-24 MED ORDER — CLONIDINE HCL 0.2 MG PO TABS
0.2000 mg | ORAL_TABLET | Freq: Two times a day (BID) | ORAL | Status: DC
  Administered 2015-02-24 – 2015-03-01 (×10): 0.2 mg via ORAL
  Filled 2015-02-24 (×10): qty 1

## 2015-02-24 NOTE — Progress Notes (Addendum)
Pt in tears continuing to complain of bladder spasms. Pt's daughter in law is very upset that pt is "going down hill" as she is still SOB, although sats remain above 95% and ABGs were WNL. MD spoke with pt's daughter in law on phone and is coming to assess patient.

## 2015-02-24 NOTE — Progress Notes (Signed)
Patient ID: Jaiyla Granados, female   DOB: 10-12-1940, 74 y.o.   MRN: [id number]    Subjective: Mrs. Hieronymus was admitted for pulmonary edema and hypokalemia with ARI found on her preop clearance exam.  She has severe pelvic prolapse with bilateral ureteral obstruction and hydronephrosis.  She is scheduled for prolapse repair with hysterectomy on 11/1.   She had a foley placed at admission and it is draining clear urine but she has spasms with marked pain and drains around the foley.  The family is concerned about the foley placement and desire the surgery be moved up.   ROS:  Review of Systems  Constitutional: Positive for fever.  Gastrointestinal: Negative for abdominal pain and constipation.  Genitourinary: Positive for urgency. Negative for flank pain.    Anti-infectives: Anti-infectives    None      Current Facility-Administered Medications  Medication Dose Route Frequency Provider Last Rate Last Dose  . acetaminophen (TYLENOL) tablet 650 mg  650 mg Oral Q6H PRN Collier Salina, MD   650 mg at 02/24/15 1634  . carvedilol (COREG) tablet 6.25 mg  6.25 mg Oral BID Dellia Nims, MD   6.25 mg at 02/24/15 0835  . cloNIDine (CATAPRES) tablet 0.2 mg  0.2 mg Oral BID Jones Bales, MD      . colchicine tablet 0.3 mg  0.3 mg Oral Daily Collier Salina, MD   0.3 mg at 02/24/15 0835  . diclofenac sodium (VOLTAREN) 1 % transdermal gel 2 g  2 g Topical QID Collier Salina, MD   2 g at 02/24/15 1400  . enoxaparin (LOVENOX) injection 25 mg  25 mg Subcutaneous Q24H Annia Belt, MD      . Derrill Memo ON 02/25/2015] enoxaparin (LOVENOX) injection 30 mg  30 mg Subcutaneous Q24H Annia Belt, MD      . HYDROcodone-acetaminophen (NORCO/VICODIN) 5-325 MG per tablet 1 tablet  1 tablet Oral Once Liberty Handy, MD   Stopped at 02/23/15 0000  . ipratropium-albuterol (DUONEB) 0.5-2.5 (3) MG/3ML nebulizer solution 3 mL  3 mL Nebulization Once Liberty Handy, MD   3 mL at 02/23/15 2017  . oxybutynin  (DITROPAN) tablet 2.5 mg  2.5 mg Oral BID Jones Bales, MD      . pantoprazole (PROTONIX) EC tablet 40 mg  40 mg Oral Daily Tasrif Ahmed, MD   40 mg at 02/24/15 0835  . [START ON 02/25/2015] potassium chloride SA (K-DUR,KLOR-CON) CR tablet 40 mEq  40 mEq Oral BID Jones Bales, MD         Objective: Vital signs in last 24 hours: Temp:  [98 F (36.7 C)-101.1 F (38.4 C)] 101.1 F (38.4 C) (10/01 1700) Pulse Rate:  [65-82] 70 (10/01 1700) Resp:  [19-20] 20 (10/01 1700) BP: (142-197)/(49-77) 157/71 mmHg (10/01 1700) SpO2:  [95 %-98 %] 98 % (10/01 1700) Weight:  [53.524 kg (118 lb)] 53.524 kg (118 lb) (09/30 2124)  Intake/Output from previous day: 09/30 0701 - 10/01 0700 In: 960 [P.O.:960] Out: 2425 [Urine:2425] Intake/Output this shift: Total I/O In: 240 [P.O.:240] Out: 1525 [Urine:1525]   Physical Exam  Constitutional: She is well-developed, well-nourished, and in no distress.  Abdominal: Soft. She exhibits no mass. There is no tenderness.  Genitourinary:  She has complete prolapse extending about 8-10 cm distal to the introitus.  I was unable to reduce the prolapse.  The foley is in good position and freely mobile in the bladder.  It is draining clear urine.  Lab Results:   Recent Labs  02/23/15 0506 02/24/15 0621  WBC 6.0 7.5  HGB 8.3* 9.7*  HCT 24.3* 29.1*  PLT 200 210   BMET  Recent Labs  02/23/15 1923 02/24/15 0621  NA 137 140  K 3.3* 3.9  CL 100* 104  CO2 28 28  GLUCOSE 156* 122*  BUN 21* 20  CREATININE 1.61* 1.60*  CALCIUM 7.8* 8.4*   PT/INR No results for input(s): LABPROT, INR in the last 72 hours. ABG  Recent Labs  02/24/15 1225  PHART 7.395  HCO3 24.5*    Studies/Results: Dg Chest Port 1 View  02/23/2015   CLINICAL DATA:  Short of breath  EXAM: PORTABLE CHEST 1 VIEW  COMPARISON:  02/22/2015  FINDINGS: Cardiac enlargement. Progressive vascular congestion and mild edema. Minimal left effusion. Mild bibasilar atelectasis.   IMPRESSION: Congestive heart failure with interstitial edema. Mild progression from yesterday.   Electronically Signed   By: Franchot Gallo M.D.   On: 02/23/2015 19:22     Assessment and Plan: She has prolapse with bilateral ureteral obstruction with ARI and also retention.  She is having spasms with the foley.  She has a fever to 101.1 today but her UA was clear on 9/29.    I will start her on oxybutynin for the spasms and a stool softener because of the constipating effect of the med.   I have notified Dr. Matilde Sprang of the admission and the desire to move the surgery up.        LOS: 2 days    Malka So 02/24/2015 [phone]

## 2015-02-24 NOTE — Consult Note (Signed)
WOC wound consult note Reason for Consult: Consulted for genitalia wound.  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 My visit immediately follows service recovery visit with house supervisor who was asked to visit patient and family as they attempt to navigate services to expedite bladder repair earlier than the scheduled date of surgery due to discomfort and other untoward effects of prolapse. Wound type:Partial thickness abrasion of bladder  Pressure Ulcer POA: No Measurement: Scattered pinpoint areas in an area approximately 2cm x 3cm. Wound FUX:NATF, moist Drainage (amount, consistency, odor) Serous Periwound: N/A Dressing procedure/placement/frequency: Small pieces of toilet tissue are adhering to the exposed, prolapsed bladder.  I will ask Nursing to cleanse these away with NS and to perform this routinely as needed.  A petrolatum gauze can be applied to the abraded area for comfort and replaced PRN. Paitent and her family are advocating for an earlier than scheduled repair of the prolapse; they are taught that with other factors (breathing difficulties and infection) this may or may not be possible.  Consideration of a pessary (vaginal) may be a consideration to support bladder until surgery.  In any event, GYN and Urology consults are indicated.  If you agree, please order. [place] nursing team will not follow, but will remain available to this patient, the nursing and medical teams.  Please re-consult if needed. Thanks, Maudie Flakes, MSN, RN, [place], [place], [place] [phone])

## 2015-02-24 NOTE — Progress Notes (Signed)
02/24/2015  House Coverage Ron at bedside to speak with patient and family regarding concerns about care.   [employer], RN-BC, RN3 [employer] [address] Phone [phone]  G[facility]er Nurse at this time)

## 2015-02-24 NOTE — Progress Notes (Signed)
02/24/2015 8:46 AM  Spoke to patient and family regarding care concerns and assisted in service recovery. Informed attending MD, House Coverage and Department Director of situation.   [employer], RN-BC, RN3 [employer] [address] Phone [phone] Gaffer Nurse at this time)

## 2015-02-24 NOTE — Progress Notes (Signed)
Late Entry - At the beginning of the shift, the patient was c/o increased shortness of breath.  Chest xray had been done prior to shift change.  MD aware of results.  MD made aware of SOB.  Breathing treatment administered and 20 mg po lasix given.  Patient slept well during the night with decreased swelling to left hand and bilateral lower extremities.  Breathing seems better to patient.  She is complaining of slight bleeding to bottom of vaginal protrusion.  The area appears red, raw, and irritated.  Encouraged patient to turn from side to side to decrease pressure to area.  Also, encouraged patient to take deep breaths and cough frequently while awake.  Will continue to monitor patient.  [employer] RN-BC, WTA.

## 2015-02-24 NOTE — Progress Notes (Signed)
Subjective:   Day of hospitalization: 2  VSS.  However, pt is feeling very short of breath although O2 sats were wnl.  She and her family are also concerned that she has not gotten her surgery but I explained that urology and gynecology have control over this.     Objective:   Vital signs in last 24 hours: Filed Vitals:   02/23/15 2318 02/24/15 0511 02/24/15 0858 02/24/15 0910  BP: 142/49 164/49  176/77  Pulse: 69 65 82 72  Temp: 99.3 F (37.4 C) 98.2 F (36.8 C)  98 F (36.7 C)  TempSrc:    Oral  Resp:  19  19  Height:      Weight:      SpO2:  98% 98% 97%    Weight: Filed Weights   02/22/15 1626 02/23/15 0500 02/23/15 2124  Weight: 115 lb 8.3 oz (52.4 kg) 111 lb 15.9 oz (50.8 kg) 118 lb (53.524 kg)    I/Os:  Intake/Output Summary (Last 24 hours) at 02/24/15 1322 Last data filed at 02/24/15 1300  Gross per 24 hour  Intake    720 ml  Output   2200 ml  Net  -1480 ml    Physical Exam: Constitutional: Vital signs reviewed.  Patient is lying in bed in no acute distress and cooperative with exam.   HEENT: [address]/AT; EOMI, conjunctivae normal, no scleral icterus  Cardiovascular: RRR, no MRG Pulmonary/Chest: normal respiratory effort, no accessory muscle use, CTAB, no wheezes, rales, or rhonchi Abdominal: Soft. +BS, NT/ND Neurological: A&O x3, CN II-XII grossly intact; non-focal exam Extremities: 2+DP b/l,  trace pitting edema b/l Skin: Warm, dry and intact.   Genital: Foley catheter in place.   Lab Results:  BMP:  Recent Labs Lab 02/23/15 0506 02/23/15 1923 02/24/15 0621  NA 142 137 140  K 2.2* 3.3* 3.9  CL 104 100* 104  CO2 29 28 28   GLUCOSE 114* 156* 122*  BUN 26* 21* 20  CREATININE 1.85* 1.61* 1.60*  CALCIUM 7.4* 7.8* 8.4*  MG 1.8 2.2  --   PHOS  --  1.8* 2.4*    CBC:  Recent Labs Lab 02/22/15 1043  02/23/15 0506 02/24/15 0621  WBC 8.0  < > 6.0 7.5  NEUTROABS 5.8  --  4.1  --   HGB 9.5*  < > 8.3* 9.7*  HCT 27.5*  < > 24.3* 29.1*  MCV  83.3  < > 84.4 86.4  PLT 214  < > 200 210  < > = values in this interval not displayed.  Coagulation: No results for input(s): LABPROT, INR in the last 168 hours.  CBG:           No results for input(s): GLUCAP in the last 168 hours.         HA1C:      No results for input(s): HGBA1C in the last 168 hours.  Lipid Panel: No results for input(s): CHOL, HDL, LDLCALC, TRIG, CHOLHDL, LDLDIRECT in the last 168 hours.  LFTs:  Recent Labs Lab 02/23/15 1923 02/24/15 0621  ALBUMIN 2.8* 2.8*    Pancreatic Enzymes: No results for input(s): LIPASE, AMYLASE in the last 168 hours.  Lactic Acid/Procalcitonin: No results for input(s): LATICACIDVEN, PROCALCITON in the last 168 hours.  Ammonia: No results for input(s): AMMONIA in the last 168 hours.  Cardiac Enzymes: No results for input(s): CKTOTAL, CKMB, CKMBINDEX, TROPONINI in the last 168 hours.  EKG: EKG Interpretation  Date/Time:  Thursday February 22 2015 14:16:41 EDT  Ventricular Rate:  85 PR Interval:  135 QRS Duration: 89 QT Interval:  406 QTC Calculation: 483 R Axis:   42 Text Interpretation:  Sinus rhythm Borderline repolarization abnormality No previous tracing Confirmed by POLLINA  MD, CHRISTOPHER ([id number]) on 02/22/2015 2:40:43 PM   BNP: No results for input(s): PROBNP in the last 168 hours.  D-Dimer: No results for input(s): DDIMER in the last 168 hours.  Urinalysis:  Recent Labs Lab 02/22/15 1710  COLORURINE YELLOW  LABSPEC 1.008  PHURINE 5.5  GLUCOSEU NEGATIVE  HGBUR SMALL*  BILIRUBINUR NEGATIVE  KETONESUR NEGATIVE  PROTEINUR NEGATIVE  UROBILINOGEN 0.2  NITRITE NEGATIVE  LEUKOCYTESUR NEGATIVE    Micro Results: No results found for this or any previous visit (from the past 240 hour(s)).  Blood Culture: No results found for: SDES, SPECREQUEST, CULT, REPTSTATUS  Studies/Results: Dg Chest 2 View  02/22/2015   CLINICAL DATA:  Shortness of breath today.  EXAM: CHEST  2 VIEW  COMPARISON:  Single  view of the chest 10/29/2007.  FINDINGS: There is cardiomegaly and mild interstitial edema. Kerley B-lines are noted. Hiatal hernia is seen. No pneumothorax or pleural effusion.  IMPRESSION: Cardiomegaly and interstitial pulmonary edema.  Hiatal hernia.   Electronically Signed   By: Inge Rise M.D.   On: 02/22/2015 14:58   US Renal  02/22/2015   CLINICAL DATA:  Abnormal renal function tests.  Hypoproteinemia.  EXAM: RENAL / URINARY TRACT ULTRASOUND COMPLETE  COMPARISON:  CT abdomen and pelvis 12/22/2014.  FINDINGS: Right Kidney:  Length: 9.5 cm. Echogenicity within normal limits. There is severe hydronephrosis as seen on the comparison examination. No mass visualized.  Left Kidney:  Length: 11.1 cm. Echogenicity within normal limits. There is severe hydronephrosis is seen on the comparison examination. No mass visualized.  Bladder:  Appears normal for degree of bladder distention. Ureteral jets are not identified.  IMPRESSION: Persistent severe bilateral hydronephrosis. Cause for obstruction is not identified. Prior CT scan raised the possibility of obstruction secondary to pelvic floor laxity.   Electronically Signed   By: Inge Rise M.D.   On: 02/22/2015 15:46   Dg Chest Port 1 View  02/23/2015   CLINICAL DATA:  Short of breath  EXAM: PORTABLE CHEST 1 VIEW  COMPARISON:  02/22/2015  FINDINGS: Cardiac enlargement. Progressive vascular congestion and mild edema. Minimal left effusion. Mild bibasilar atelectasis.  IMPRESSION: Congestive heart failure with interstitial edema. Mild progression from yesterday.   Electronically Signed   By: Franchot Gallo M.D.   On: 02/23/2015 19:22    Medications:  Scheduled Meds: . carvedilol  6.25 mg Oral BID  . cloNIDine  0.1 mg Oral BID  . colchicine  0.3 mg Oral Daily  . diclofenac sodium  2 g Topical QID  . enoxaparin (LOVENOX) injection  25 mg Subcutaneous Q24H  . HYDROcodone-acetaminophen  1 tablet Oral Once  . ipratropium-albuterol  3 mL Nebulization  Once  . pantoprazole  40 mg Oral Daily  . potassium chloride  40 mEq Oral TID   Continuous Infusions:  PRN Meds: acetaminophen  Antibiotics: Antibiotics Given (last 72 hours)    None      Day of Hospitalization: 2  Consults:    Assessment/Plan:   Active Problems:   Hypokalemia   Bilateral hydronephrosis   Obstructive uropathy  Bilateral hydronephrosis with obstructive uropathy  Suspected due to uterine prolapse.  Spoke with urology who will see the patient as an outpatient but to keep foley in place. SCr stable.  -cont current mgmt and  f/u with urology  Dyspnea Pt c/o dyspnea but O2 sats are wnl and is not tachycardic or tachypnic making PE less likely.  CXR shows some mild vascular congestion with recent EF normal and grade 1 DD.  BNP mildly elevated.  Given 1 dose of lasix last PM and states it didn't help her much.  Could also be related to anxiety. -will obtain ABG, EKG -may need to obtain V/Q scan if no improvement -will have RN get up to chair  -incentive spirometry   F/E/N Fluids- None  Electrolytes- Replete as needed Nutrition- HH  VTE PPx  lovenox 40mg  SQ qd   Disposition Disposition is deferred, awaiting improvement of current medical problems.  Anticipated discharge in approximately 1-2 day(s).     LOS: 2 days   Jones Bales, MD PGY-3, Internal Medicine Teaching Service 02/24/2015, 1:22 PM

## 2015-02-24 NOTE — Progress Notes (Signed)
ANTIBIOTIC CONSULT NOTE - INITIAL  Pharmacy Consult for Ceftriaxone Indication: r/o UTI  Allergies  Allergen Reactions  . Amlodipine Swelling    Lower extremity edema   Patient Measurements: Height: 4\' 7"  (139.7 cm) Weight: 118 lb (53.524 kg) IBW/kg (Calculated) : 34  Vital Signs: Temp: 101.1 F (38.4 C) (10/01 1700) Temp Source: Oral (10/01 1700) BP: 157/71 mmHg (10/01 1700) Pulse Rate: 70 (10/01 1700) Intake/Output from previous day: 09/30 0701 - 10/01 0700 In: 960 [P.O.:960] Out: 2425 [Urine:2425] Intake/Output from this shift: Total I/O In: 240 [P.O.:240] Out: 1525 [Urine:1525]  Labs:  Recent Labs  02/22/15 1710 02/22/15 1810 02/23/15 0506 02/23/15 1923 02/24/15 0621  WBC  --  9.8 6.0  --  7.5  HGB  --  9.6* 8.3*  --  9.7*  PLT  --  258 200  --  210  LABCREA 44.18  --   --   --   --   CREATININE  --   --  1.85* 1.61* 1.60*   Estimated Creatinine Clearance: 20.7 mL/min (by C-G formula based on Cr of 1.6). No results for input(s): VANCOTROUGH, VANCOPEAK, VANCORANDOM, GENTTROUGH, GENTPEAK, GENTRANDOM, TOBRATROUGH, TOBRAPEAK, TOBRARND, AMIKACINPEAK, AMIKACINTROU, AMIKACIN in the last 72 hours.   Microbiology: No results found for this or any previous visit (from the past 720 hour(s)).  Medical History: Past Medical History  Diagnosis Date  . Hypertension   . Uterine prolapse   . Hydronephrosis     bilateral  . Esophageal reflux   . Cystocele, midline   . Old myocardial infarction   . Hyperlipidemia    Medications:  Anti-infectives    Start     Dose/Rate Route Frequency Ordered Stop   02/24/15 1900  cefTRIAXone (ROCEPHIN) 1 g in dextrose 5 % 50 mL IVPB     1 g 100 mL/hr over 30 Minutes Intravenous Every 24 hours 02/24/15 1859       Assessment: 74 yo female admitted with renal insufficiency and urinary retention.  She has surgery planned for November but may need this moved up.  She has had some fever spikes to 101.1.  Creatinine 1.6 and she has  an estimated crcl of 20 ml/min.  Ceftriaxone doesn't require renal dosing however and we will give full dose.  Goal of Therapy:  Therapeutic response to IV Ceftriaxone  Plan:  Ceftriaxone 1gm every 24 hr Monitor culture data  Rober Minion, PharmD., MS Clinical Pharmacist Pager:  [phone] Thank you for allowing pharmacy to be part of this patients care team. 02/24/2015,6:59 PM

## 2015-02-24 NOTE — Progress Notes (Signed)
House Coverage, Ron, spoke with patient and her two sisters. Ron instructed this RN to ask Dr. Beryle Beams to please come speak with patient and family regarding their concerns. Dr. Beryle Beams spoke with pt however no family was present. At this time, the pt's daughter is here and is also demanding to speak to MD about the same concerns. Dr. Gordy Levan notified.  Tyna Jaksch, RN

## 2015-02-24 NOTE — Progress Notes (Signed)
Pt c/o bladder spasms and a pressure that "feels like she still has to pee." Foley is putting out urine. Attempted to bladder scan, nothing shown. Dr. Gordy Levan paged. Awaiting orders. Will continue to monitor.  Tyna Jaksch, RN

## 2015-02-25 ENCOUNTER — Inpatient Hospital Stay (HOSPITAL_COMMUNITY): Payer: BLUE CROSS/BLUE SHIELD

## 2015-02-25 DIAGNOSIS — R0600 Dyspnea, unspecified: Secondary | ICD-10-CM

## 2015-02-25 DIAGNOSIS — I509 Heart failure, unspecified: Secondary | ICD-10-CM

## 2015-02-25 DIAGNOSIS — I110 Hypertensive heart disease with heart failure: Secondary | ICD-10-CM

## 2015-02-25 LAB — URINALYSIS, ROUTINE W REFLEX MICROSCOPIC
Bilirubin Urine: NEGATIVE
GLUCOSE, UA: NEGATIVE mg/dL
Ketones, ur: NEGATIVE mg/dL
Nitrite: NEGATIVE
PROTEIN: 30 mg/dL — AB
SPECIFIC GRAVITY, URINE: 1.01 (ref 1.005–1.030)
Urobilinogen, UA: 0.2 mg/dL (ref 0.0–1.0)
pH: 6.5 (ref 5.0–8.0)

## 2015-02-25 LAB — CBC WITH DIFFERENTIAL/PLATELET
BASOS ABS: 0 10*3/uL (ref 0.0–0.1)
Basophils Relative: 0 %
EOS ABS: 0.2 10*3/uL (ref 0.0–0.7)
EOS PCT: 2 %
HCT: 27.5 % — ABNORMAL LOW (ref 36.0–46.0)
Hemoglobin: 9 g/dL — ABNORMAL LOW (ref 12.0–15.0)
LYMPHS ABS: 1.1 10*3/uL (ref 0.7–4.0)
Lymphocytes Relative: 12 %
MCH: 28.3 pg (ref 26.0–34.0)
MCHC: 32.7 g/dL (ref 30.0–36.0)
MCV: 86.5 fL (ref 78.0–100.0)
Monocytes Absolute: 0.5 10*3/uL (ref 0.1–1.0)
Monocytes Relative: 6 %
Neutro Abs: 6.8 10*3/uL (ref 1.7–7.7)
Neutrophils Relative %: 80 %
PLATELETS: 223 10*3/uL (ref 150–400)
RBC: 3.18 MIL/uL — AB (ref 3.87–5.11)
RDW: 13.6 % (ref 11.5–15.5)
WBC: 8.6 10*3/uL (ref 4.0–10.5)

## 2015-02-25 LAB — RENAL FUNCTION PANEL
ANION GAP: 8 (ref 5–15)
Albumin: 2.5 g/dL — ABNORMAL LOW (ref 3.5–5.0)
BUN: 23 mg/dL — ABNORMAL HIGH (ref 6–20)
CALCIUM: 8.1 mg/dL — AB (ref 8.9–10.3)
CHLORIDE: 101 mmol/L (ref 101–111)
CO2: 29 mmol/L (ref 22–32)
CREATININE: 1.84 mg/dL — AB (ref 0.44–1.00)
GFR, EST AFRICAN AMERICAN: 30 mL/min — AB (ref >60)
GFR, EST NON AFRICAN AMERICAN: 26 mL/min — AB (ref >60)
Glucose, Bld: 121 mg/dL — ABNORMAL HIGH (ref 65–99)
Phosphorus: 2.4 mg/dL — ABNORMAL LOW (ref 2.5–4.6)
Potassium: 4 mmol/L (ref 3.5–5.1)
Sodium: 138 mmol/L (ref 135–145)

## 2015-02-25 LAB — URINE MICROSCOPIC-ADD ON

## 2015-02-25 MED ORDER — FUROSEMIDE 10 MG/ML IJ SOLN: 20.0000 mg | Freq: Every day | INTRAMUSCULAR | Status: DC | PRN

## 2015-02-25 NOTE — Assessment & Plan Note (Signed)
Foley is draining well today and the bladder spasms have improved with oxybutynin.    She has some pyuria and hematuria on the UA from last night but I had tried to reduce her prolapse while assessing foley placement and the could be the cause.    Continue oxybutynin and foley drainage.

## 2015-02-25 NOTE — Progress Notes (Signed)
CM reviewed patient's record.  Patient has Prolapse pelvic organs, surgery scheduled for 11/1 with Dr. Lonna Cobb.  U/A done 10/1 showed mod leukocytes. patient continues on Rocephin 1gm IVPB daily. Foley cath still in-place draining well. CM will continue to follow for discharge planning.

## 2015-02-25 NOTE — Progress Notes (Signed)
Patient's son and daughter-in-law have been in patient's room entire night.  The daughter-in-law has multiple questions and multiple complaints.  She feels strongly that patient needs hysterectomy/bladder tack sooner than later.  She noticed that cranberry colored urine in the foley catheter tubing.  I also noticed this small amount.  I explained that we would keep an eye on it.  She is also concerned that the urine looked cloudy.  [place] came to do the patient abdominal ultrasound.  The tech told the family that she was unable to visualize the bladder.  The daughter-in-law was very irate the bladder was not visualized since that was part of the reason the scan was done.  I explained that the doctors would review the scan themselves.  I called and made the Md aware of the above.  Later in the night, I went to get the UA previously ordered prior to shift change.  She was very irate with a MD that had come in prior to me and stated that he did not adequately address her concerns.  She stated he did not look at her urine and now was ordering a urine.  I explained to her that the urine sample I was obtaining was ordered prior to my call to the MD with her concerns.  I also apologized for any misunderstanding.  I applied vaseline gauze to exposed area on the peritoneum to help protect the area and help to prevent from burning.  Will continue to monitor the patient.  [employer] RN-BC, WTA.

## 2015-02-25 NOTE — Progress Notes (Signed)
Patient ID: Mariah Phillips, female   DOB: 04-08-1941, 74 y.o.   MRN: [id number]    Subjective: Mariah Phillips is doing better today with no further bladder spasms on oxybutynin.  She had some hematuria yesterday but that was probably from me manipulating the prolapse.   The hematuria has resolved.  ROS:  Review of Systems  Constitutional: Negative for fever and chills.  Gastrointestinal: Negative for abdominal pain.  Genitourinary: Negative for hematuria.    Anti-infectives: Anti-infectives    Start     Dose/Rate Route Frequency Ordered Stop   02/24/15 1900  cefTRIAXone (ROCEPHIN) 1 g in dextrose 5 % 50 mL IVPB     1 g 100 mL/hr over 30 Minutes Intravenous Every 24 hours 02/24/15 1859        Current Facility-Administered Medications  Medication Dose Route Frequency Provider Last Rate Last Dose  . acetaminophen (TYLENOL) tablet 650 mg  650 mg Oral Q6H PRN Collier Salina, MD   650 mg at 02/24/15 1634  . carvedilol (COREG) tablet 6.25 mg  6.25 mg Oral BID Dellia Nims, MD   6.25 mg at 02/24/15 2221  . cefTRIAXone (ROCEPHIN) 1 g in dextrose 5 % 50 mL IVPB  1 g Intravenous Q24H Durwin Nora, RPH   1 g at 02/24/15 2026  . cloNIDine (CATAPRES) tablet 0.2 mg  0.2 mg Oral BID Jones Bales, MD   0.2 mg at 02/24/15 2221  . colchicine tablet 0.3 mg  0.3 mg Oral Daily Collier Salina, MD   0.3 mg at 02/24/15 0835  . diclofenac sodium (VOLTAREN) 1 % transdermal gel 2 g  2 g Topical QID Collier Salina, MD   2 g at 02/24/15 2222  . enoxaparin (LOVENOX) injection 30 mg  30 mg Subcutaneous Q24H Annia Belt, MD      . HYDROcodone-acetaminophen (NORCO/VICODIN) 5-325 MG per tablet 1 tablet  1 tablet Oral Once Liberty Handy, MD   Stopped at 02/23/15 0000  . ipratropium-albuterol (DUONEB) 0.5-2.5 (3) MG/3ML nebulizer solution 3 mL  3 mL Nebulization Once Liberty Handy, MD   3 mL at 02/23/15 2017  . oxybutynin (DITROPAN) tablet 2.5 mg  2.5 mg Oral BID Jones Bales, MD   2.5 mg at 02/24/15  1853  . pantoprazole (PROTONIX) EC tablet 40 mg  40 mg Oral Daily Tasrif Ahmed, MD   40 mg at 02/24/15 0835  . potassium chloride SA (K-DUR,KLOR-CON) CR tablet 40 mEq  40 mEq Oral BID Jones Bales, MD         Objective: Vital signs in last 24 hours: Temp:  [98.8 F (37.1 C)-101.1 F (38.4 C)] 99.4 F (37.4 C) (10/02 0500) Pulse Rate:  [70-78] 78 (10/02 0500) Resp:  [18-20] 18 (10/02 0500) BP: (157-169)/(48-71) 167/60 mmHg (10/02 0500) SpO2:  [98 %-99 %] 99 % (10/02 0500) Weight:  [53.298 kg (117 lb 8 oz)] 53.298 kg (117 lb 8 oz) (10/01 2043)  Intake/Output from previous day: 10/01 0701 - 10/02 0700 In: 410 [P.O.:360; IV Piggyback:50] Out: 1525 [ZWCHE:5277] Intake/Output this shift:     Physical Exam  Constitutional: She is well-developed, well-nourished, and in no distress.  Vitals reviewed.   Lab Results:   Recent Labs  02/24/15 0621 02/25/15 0855  WBC 7.5 8.6  HGB 9.7* 9.0*  HCT 29.1* 27.5*  PLT 210 223   BMET  Recent Labs  02/24/15 0621 02/25/15 0358  NA 140 138  K 3.9 4.0  CL 104 101  CO2 28  29  GLUCOSE 122* 121*  BUN 20 23*  CREATININE 1.60* 1.84*  CALCIUM 8.4* 8.1*   PT/INR No results for input(s): LABPROT, INR in the last 72 hours. ABG  Recent Labs  02/24/15 1225  PHART 7.395  HCO3 24.5*    Studies/Results: Dg Chest 2 View  02/25/2015   CLINICAL DATA:  Shortness of Breath  EXAM: CHEST  2 VIEW  COMPARISON:  February 22, 2015 and February 23, 2015  FINDINGS: Lungs are mildly hyperexpanded. There has been interval resolution of interstitial pulmonary edema. There is a small left pleural effusion with mild left base atelectasis. Lungs elsewhere clear. Heart is upper normal in size with pulmonary vascularity within normal limits. No adenopathy. There is a small hiatal hernia. No bone lesions.  IMPRESSION: Small left pleural effusion with mild left base atelectasis. Interval resolution of interstitial edema. Small hiatal hernia. Heart upper  normal in size with pulmonary vascularity within normal limits.   Electronically Signed   By: Lowella Grip III M.D.   On: 02/25/2015 10:07   US Renal  02/25/2015   CLINICAL DATA:  Follow-up known severe bilateral hydronephrosis. Subsequent encounter.  EXAM: RENAL / URINARY TRACT ULTRASOUND COMPLETE  COMPARISON:  Renal ultrasound performed 02/22/2015, and CT of the abdomen and pelvis performed 12/22/2014  FINDINGS: Right Kidney:  Length: 10.5 cm. Echogenicity within normal limits. No mass visualized. There is persistent severe chronic right-sided hydronephrosis.  Left Kidney:  Length: 12.2 cm. Echogenicity within normal limits. No mass visualized. There is persistent severe chronic left-sided hydronephrosis.  Bladder:  Not seen on this study. The known Foley catheter is not well characterized.  Small bilateral pleural effusions are seen.  IMPRESSION: 1. Persistent severe chronic bilateral hydronephrosis noted. This is unchanged from prior studies. 2. Small bilateral pleural effusions noted.   Electronically Signed   By: Garald Balding M.D.   On: 02/25/2015 00:37   Dg Chest Port 1 View  02/23/2015   CLINICAL DATA:  Short of breath  EXAM: PORTABLE CHEST 1 VIEW  COMPARISON:  02/22/2015  FINDINGS: Cardiac enlargement. Progressive vascular congestion and mild edema. Minimal left effusion. Mild bibasilar atelectasis.  IMPRESSION: Congestive heart failure with interstitial edema. Mild progression from yesterday.   Electronically Signed   By: Franchot Gallo M.D.   On: 02/23/2015 19:22   Labs, UA and CXR films and report reviewed.   Assessment and Plan: Urinary retention Foley is draining well today and the bladder spasms have improved with oxybutynin.    She has some pyuria and hematuria on the UA from last night but I had tried to reduce her prolapse while assessing foley placement and the could be the cause.    Continue oxybutynin and foley drainage.        LOS: 3 days    Mariah Phillips 02/25/2015 [phone]

## 2015-02-25 NOTE — Progress Notes (Signed)
  Echocardiogram 2D Echocardiogram has been performed.  Darlina Sicilian M 02/25/2015, 4:36 PM

## 2015-02-25 NOTE — Progress Notes (Signed)
Subjective:   Day of hospitalization: 3  VSS.  Pt's family again concerned that she has not gotten her surgery and that this would "fix" all of her problems.  However, pt feels her breathing has somewhat improved since lasix.     Objective:   Vital signs in last 24 hours: Filed Vitals:   02/24/15 0910 02/24/15 1700 02/24/15 2043 02/25/15 0500  BP: 176/77 157/71 169/48 167/60  Pulse: 72 70 71 78  Temp: 98 F (36.7 C) 101.1 F (38.4 C) 98.8 F (37.1 C) 99.4 F (37.4 C)  TempSrc: Oral Oral Oral Oral  Resp: 19 20 20 18   Height:      Weight:   117 lb 8 oz (53.298 kg)   SpO2: 97% 98% 98% 99%    Weight: Filed Weights   02/23/15 0500 02/23/15 2124 02/24/15 2043  Weight: 111 lb 15.9 oz (50.8 kg) 118 lb (53.524 kg) 117 lb 8 oz (53.298 kg)    I/Os:  Intake/Output Summary (Last 24 hours) at 02/25/15 1015 Last data filed at 02/25/15 0600  Gross per 24 hour  Intake    290 ml  Output   1175 ml  Net   -885 ml    Physical Exam: Constitutional: Vital signs reviewed.  Patient is lying in bed in no acute distress and cooperative with exam.   HEENT: [place]/AT; EOMI, conjunctivae normal, no scleral icterus  Cardiovascular: RRR, no MRG Pulmonary/Chest: normal respiratory effort, no accessory muscle use, CTAB, no wheezes, rales, or rhonchi Abdominal: Soft. +BS, NT/ND Neurological: A&O x3, CN II-XII grossly intact; non-focal exam Extremities: 2+DP b/l,  trace pitting edema b/l Skin: Warm, dry and intact.   Genital: Foley catheter in place.   Lab Results:  BMP:  Recent Labs Lab 02/23/15 0506  02/23/15 1923 02/24/15 0621 02/25/15 0358  NA 142  --  137 140 138  K 2.2*  --  3.3* 3.9 4.0  CL 104  --  100* 104 101  CO2 29  --  28 28 29   GLUCOSE 114*  --  156* 122* 121*  BUN 26*  --  21* 20 23*  CREATININE 1.85*  --  1.61* 1.60* 1.84*  CALCIUM 7.4*  --  7.8* 8.4* 8.1*  MG 1.8  --  2.2  --   --   PHOS  --   < > 1.8* 2.4* 2.4*  < > = values in this interval not  displayed.  CBC:  Recent Labs Lab 02/23/15 0506 02/24/15 0621 02/25/15 0855  WBC 6.0 7.5 8.6  NEUTROABS 4.1  --  6.8  HGB 8.3* 9.7* 9.0*  HCT 24.3* 29.1* 27.5*  MCV 84.4 86.4 86.5  PLT 200 210 223    Coagulation: No results for input(s): LABPROT, INR in the last 168 hours.  CBG:           No results for input(s): GLUCAP in the last 168 hours.         HA1C:      No results for input(s): HGBA1C in the last 168 hours.  Lipid Panel: No results for input(s): CHOL, HDL, LDLCALC, TRIG, CHOLHDL, LDLDIRECT in the last 168 hours.  LFTs:  Recent Labs Lab 02/24/15 0621 02/25/15 0358  ALBUMIN 2.8* 2.5*    Pancreatic Enzymes: No results for input(s): LIPASE, AMYLASE in the last 168 hours.  Lactic Acid/Procalcitonin: No results for input(s): LATICACIDVEN, PROCALCITON in the last 168 hours.  Ammonia: No results for input(s): AMMONIA in the last 168 hours.  Cardiac  Enzymes: No results for input(s): CKTOTAL, CKMB, CKMBINDEX, TROPONINI in the last 168 hours.  EKG: EKG Interpretation  Date/Time:  Thursday February 22 2015 14:16:41 EDT Ventricular Rate:  85 PR Interval:  135 QRS Duration: 89 QT Interval:  406 QTC Calculation: 483 R Axis:   42 Text Interpretation:  Sinus rhythm Borderline repolarization abnormality No previous tracing Confirmed by POLLINA  MD, CHRISTOPHER ([id number]) on 02/22/2015 2:40:43 PM   BNP: No results for input(s): PROBNP in the last 168 hours.  D-Dimer: No results for input(s): DDIMER in the last 168 hours.  Urinalysis:  Recent Labs Lab 02/22/15 1710 02/24/15 2341  COLORURINE YELLOW YELLOW  LABSPEC 1.008 1.010  PHURINE 5.5 6.5  GLUCOSEU NEGATIVE NEGATIVE  HGBUR SMALL* LARGE*  BILIRUBINUR NEGATIVE NEGATIVE  KETONESUR NEGATIVE NEGATIVE  PROTEINUR NEGATIVE 30*  UROBILINOGEN 0.2 0.2  NITRITE NEGATIVE NEGATIVE  LEUKOCYTESUR NEGATIVE MODERATE*    Micro Results: No results found for this or any previous visit (from the past 240  hour(s)).  Blood Culture: No results found for: SDES, SPECREQUEST, CULT, REPTSTATUS  Studies/Results: Dg Chest 2 View  02/25/2015   CLINICAL DATA:  Shortness of Breath  EXAM: CHEST  2 VIEW  COMPARISON:  February 22, 2015 and February 23, 2015  FINDINGS: Lungs are mildly hyperexpanded. There has been interval resolution of interstitial pulmonary edema. There is a small left pleural effusion with mild left base atelectasis. Lungs elsewhere clear. Heart is upper normal in size with pulmonary vascularity within normal limits. No adenopathy. There is a small hiatal hernia. No bone lesions.  IMPRESSION: Small left pleural effusion with mild left base atelectasis. Interval resolution of interstitial edema. Small hiatal hernia. Heart upper normal in size with pulmonary vascularity within normal limits.   Electronically Signed   By: Lowella Grip III M.D.   On: 02/25/2015 10:07   US Renal  02/25/2015   CLINICAL DATA:  Follow-up known severe bilateral hydronephrosis. Subsequent encounter.  EXAM: RENAL / URINARY TRACT ULTRASOUND COMPLETE  COMPARISON:  Renal ultrasound performed 02/22/2015, and CT of the abdomen and pelvis performed 12/22/2014  FINDINGS: Right Kidney:  Length: 10.5 cm. Echogenicity within normal limits. No mass visualized. There is persistent severe chronic right-sided hydronephrosis.  Left Kidney:  Length: 12.2 cm. Echogenicity within normal limits. No mass visualized. There is persistent severe chronic left-sided hydronephrosis.  Bladder:  Not seen on this study. The known Foley catheter is not well characterized.  Small bilateral pleural effusions are seen.  IMPRESSION: 1. Persistent severe chronic bilateral hydronephrosis noted. This is unchanged from prior studies. 2. Small bilateral pleural effusions noted.   Electronically Signed   By: Garald Balding M.D.   On: 02/25/2015 00:37   Dg Chest Port 1 View  02/23/2015   CLINICAL DATA:  Short of breath  EXAM: PORTABLE CHEST 1 VIEW   COMPARISON:  02/22/2015  FINDINGS: Cardiac enlargement. Progressive vascular congestion and mild edema. Minimal left effusion. Mild bibasilar atelectasis.  IMPRESSION: Congestive heart failure with interstitial edema. Mild progression from yesterday.   Electronically Signed   By: Franchot Gallo M.D.   On: 02/23/2015 19:22    Medications:  Scheduled Meds: . carvedilol  6.25 mg Oral BID  . cefTRIAXone (ROCEPHIN)  IV  1 g Intravenous Q24H  . cloNIDine  0.2 mg Oral BID  . colchicine  0.3 mg Oral Daily  . diclofenac sodium  2 g Topical QID  . enoxaparin (LOVENOX) injection  30 mg Subcutaneous Q24H  . HYDROcodone-acetaminophen  1 tablet Oral Once  .  ipratropium-albuterol  3 mL Nebulization Once  . oxybutynin  2.5 mg Oral BID  . pantoprazole  40 mg Oral Daily  . potassium chloride  40 mEq Oral BID   Continuous Infusions:  PRN Meds: acetaminophen  Antibiotics: Antibiotics Given (last 72 hours)    Date/Time Action Medication Dose Rate   02/24/15 2026 Given   cefTRIAXone (ROCEPHIN) 1 g in dextrose 5 % 50 mL IVPB 1 g 100 mL/hr      Day of Hospitalization: 3  Consults: Treatment Team:  Bjorn Loser, MD  Assessment/Plan:   Principal Problem:   Obstructive uropathy Active Problems:   Hypokalemia   Bilateral hydronephrosis   Dyspnea  Bilateral hydronephrosis with obstructive uropathy  Suspected due to uterine prolapse.  Urology came by yesterday to speak with the pt and provided reassurance with Dr. Matilde Sprang to f/u on Monday.  However, pt and family very dissatisfied with the service provided.  SCr up today to 1.8.  Renal US yesterday shows no improvement in hydronephrosis.  UA not suggestive of UTI but started ceftriaxone yesterday d/t a fever spike.   -cont current mgmt and f/u with urology on Monday  -cont ceftriaxone for now and monitor  -monitor SCr   Dyspnea Pt c/o dyspnea but O2 sats are wnl and is not tachycardic or tachypnic making PE less likely.  CXR shows some  mild vascular congestion with recent EF normal and grade 1 DD.  BNP mildly elevated at 400.  Pt states breathing improved with lasix but will have to be careful with kidney function given small rise in SCr.  Likely also exacerbated by worsening kidney function.  Spoke with cardiology who recommends repeat TTE and cont with lasix PRN.  -repeat CXR today  -will have RN get up to chair  -incentive spirometry  -judicious use of lasix  -f/u with urology   HTN -cont current mgmt for now  F/E/N Fluids- None  Electrolytes- Replete as needed Nutrition- HH  VTE PPx  lovenox 40mg  SQ qd   Disposition Disposition is deferred, awaiting improvement of current medical problems.  Anticipated discharge in approximately 1-2 day(s).     LOS: 3 days   Jones Bales, MD PGY-3, Internal Medicine Teaching Service 02/25/2015, 10:15 AM

## 2015-02-26 DIAGNOSIS — R319 Hematuria, unspecified: Secondary | ICD-10-CM

## 2015-02-26 DIAGNOSIS — N390 Urinary tract infection, site not specified: Secondary | ICD-10-CM

## 2015-02-26 DIAGNOSIS — D649 Anemia, unspecified: Secondary | ICD-10-CM

## 2015-02-26 DIAGNOSIS — R509 Fever, unspecified: Secondary | ICD-10-CM | POA: Diagnosis not present

## 2015-02-26 DIAGNOSIS — B9689 Other specified bacterial agents as the cause of diseases classified elsewhere: Secondary | ICD-10-CM

## 2015-02-26 HISTORY — DX: Anemia, unspecified: D64.9

## 2015-02-26 LAB — RENAL FUNCTION PANEL
ALBUMIN: 2.4 g/dL — AB (ref 3.5–5.0)
Anion gap: 8 (ref 5–15)
BUN: 26 mg/dL — ABNORMAL HIGH (ref 6–20)
CHLORIDE: 106 mmol/L (ref 101–111)
CO2: 26 mmol/L (ref 22–32)
Calcium: 8.5 mg/dL — ABNORMAL LOW (ref 8.9–10.3)
Creatinine, Ser: 1.69 mg/dL — ABNORMAL HIGH (ref 0.44–1.00)
GFR calc Af Amer: 34 mL/min — ABNORMAL LOW (ref >60)
GFR calc non Af Amer: 29 mL/min — ABNORMAL LOW (ref >60)
Glucose, Bld: 110 mg/dL — ABNORMAL HIGH (ref 65–99)
POTASSIUM: 5.1 mmol/L (ref 3.5–5.1)
Phosphorus: 3.3 mg/dL (ref 2.5–4.6)
SODIUM: 140 mmol/L (ref 135–145)

## 2015-02-26 NOTE — Progress Notes (Signed)
I have reviewed chart and will speak to Dr Jeffie Pollock I will be down to assess patient late afternoon today Agree with continual supportive care

## 2015-02-26 NOTE — Clinical Documentation Improvement (Signed)
Internal Medicine  Can the diagnosis of CHF be further specified?    Acuity - Acute, Chronic, Acute on Chronic   Type - Systolic, Diastolic, Systolic and Diastolic  Other  Clinically Undetermined  Document any associated diagnoses/conditions   Supporting Information: Likely mild, acute CHF, was given a dose of parenteral furosemide with some improvement in her symptoms per 10/01 progress notes. 10/01: BNP: 400.4.   Please exercise your independent, professional judgment when responding. A specific answer is not anticipated or expected.   Thank You,  [place] [phone]

## 2015-02-26 NOTE — Progress Notes (Signed)
Reviewed chart in detail  Foley in place and prolapse still visible SOB/mild CHF/leg edema/atelectasis/K2.2/Hb 9/ I explained to patient that it likely is not safe to do her prolapse surgery in the face of this recent acute illness; explained to her that bilateral percutaneous tubes may be necessary to maximize renal health and overall health I will speak to primary team and to Dr Barbara Cower CALL me at [phone] to discuss treatment plan Unfortunate urine c/s not done Continue to follow serum CR HER LOW HB IS ALSO QUITE CONCERNING TO ME

## 2015-02-26 NOTE — Progress Notes (Addendum)
   Subjective: Patient states she is doing well this morning, does endorse some continued shortness of breath when getting up to go to the bathroom. Objective: Vital signs in last 24 hours: Filed Vitals:   02/25/15 1542 02/25/15 2152 02/26/15 0500 02/26/15 0931  BP: 147/52 184/55 182/57 148/50  Pulse: 60 69 72 70  Temp: 98.1 F (36.7 C) 98.6 F (37 C) 98 F (36.7 C) 98.3 F (36.8 C)  TempSrc: Oral Oral Oral Oral  Resp: 20 18 18 18   Height:      Weight:      SpO2: 98% 98% 97% 98%   Weight change:   Intake/Output Summary (Last 24 hours) at 02/26/15 1715 Last data filed at 02/26/15 1300  Gross per 24 hour  Intake    600 ml  Output   1225 ml  Net   -625 ml   General: sitting in chair, eating, NAD Cardiac: RRR, no rubs, murmurs or gallops Pulm: clear to auscultation bilaterally Abd: soft, nontender, nondistended Ext: warm and well perfused, no pedal edema Neuro: alert and oriented X3  Assessment/Plan: Principal Problem:   Obstructive uropathy Active Problems:   Essential hypertension   Acute kidney injury (HCC)   Bilateral hydronephrosis   Pulmonary vascular congestion   Fever   Normocytic anemia  Bilateral hydronephrosis with obstructive uropathy: Patient with chronic bilateral hydronephrosis suspected due to uterine prolapse. Urology following and Dr. Matilde Sprang to see patient this afternoon. Creatinine improved to 1.69 this morning compared to 1.84 yesterday. On empiric ceftriaxone due to fever spike and UA showing 11-20 WBCs. Urine culture was not done. -Continue current management, appreciate Urology rec's -monitor SCr -Continue Ceftriaxone for total of 7 day course. Patient did have fever spike to 101 two days ago which resolved after antibiotic. This may have been coincidence, but UA showed pyuria and hematuria, will continue ceftriaxone for now.  Dyspnea: Patient states breathing is improved, although still has shortness of breath when walking to the bathroom.  Echo yesterday shows EF 55-60% and grade 2 diastolic dysfunction. CXR yesterday shows small left pleural effusion with mild left base atelectasis and interval resolution of interstitial edema. Currently on 2 L nasal cannula oxygen satting 97-99 %. -Incentive spirometry -Lasix 20 mg IV PRN for dyspnea -Consider cardiology referral in setting of worsening diastolic dysfunction, difficult to control HTN, and renal injury  HTN: -Continue current management for now: Clonidine 0.2 mg BID, Coreg 6.25 mg BID   Dispo: Disposition is deferred at this time, awaiting improvement of current medical problems.  Anticipated discharge in approximately 1-3 day(s).      LOS: 4 days   Zada Finders, MD 02/26/2015, 5:15 PM

## 2015-02-26 NOTE — Progress Notes (Signed)
Placed call and spoke with pts Daughter in law [name] at [phone] to follow up and make sure that concerns and questions had been addressed. Per [name], Dr Waymon Budge explained in detail the patients clinical picture and plan and the family felt that their questions were answered and needs were met. [name] was encouraged to contact DD or MD with any concerns that may arise in the future. She was thankful for the follow up. Haizel Gatchell, [place]

## 2015-02-26 NOTE — Progress Notes (Signed)
Patient ID: Mariah Phillips, female   DOB: 11-02-1940, 74 y.o.   MRN: [id number]        Attending progress note    Date of Admission:  02/22/2015     Principal Problem:   Obstructive uropathy Active Problems:   Essential hypertension   Acute kidney injury [facility])   Pulmonary vascular congestion   Fever   Bilateral hydronephrosis   . carvedilol  6.25 mg Oral BID  . cefTRIAXone (ROCEPHIN)  IV  1 g Intravenous Q24H  . cloNIDine  0.2 mg Oral BID  . colchicine  0.3 mg Oral Daily  . diclofenac sodium  2 g Topical QID  . enoxaparin (LOVENOX) injection  30 mg Subcutaneous Q24H  . HYDROcodone-acetaminophen  1 tablet Oral Once  . ipratropium-albuterol  3 mL Nebulization Once  . oxybutynin  2.5 mg Oral BID  . pantoprazole  40 mg Oral Daily    SUBJECTIVE: She has been having dyspnea on exertion for the past week but is feeling better today. She still got short of breath when she walked to the bathroom this morning but it is improving.  Review of Systems: Pertinent items are noted in HPI.  Past Medical History  Diagnosis Date  . Hypertension   . Uterine prolapse   . Hydronephrosis     bilateral  . Esophageal reflux   . Cystocele, midline   . Old myocardial infarction   . Hyperlipidemia     Social History  Substance Use Topics  . Smoking status: Former Research scientist (life sciences)  . Smokeless tobacco: None  . Alcohol Use: None    Family History  Problem Relation Age of Onset  . Thyroid cancer Mother   . Heart Problems Father    Allergies  Allergen Reactions  . Amlodipine Swelling    Lower extremity edema    OBJECTIVE: Filed Vitals:   02/25/15 1542 02/25/15 2152 02/26/15 0500 02/26/15 0931  BP: 147/52 184/55 182/57 148/50  Pulse: 60 69 72 70  Temp: 98.1 F (36.7 C) 98.6 F (37 C) 98 F (36.7 C) 98.3 F (36.8 C)  TempSrc: Oral Oral Oral Oral  Resp: 20 18 18 18   Height:      Weight:      SpO2: 98% 98% 97% 98%   Body mass index is 27.31 kg/(m^2).  General: she is alert and in  no distress lying in bed. Her daughter is present Skin: no rash Lungs: clear Cor: regular S1 and S2 with no murmur   Lab Results Lab Results  Component Value Date   WBC 8.6 02/25/2015   HGB 9.0* 02/25/2015   HCT 27.5* 02/25/2015   MCV 86.5 02/25/2015   PLT 223 02/25/2015    Lab Results  Component Value Date   CREATININE 1.69* 02/26/2015   BUN 26* 02/26/2015   NA 140 02/26/2015   K 5.1 02/26/2015   CL 106 02/26/2015   CO2 26 02/26/2015   No results found for: ALT, AST, GGT, ALKPHOS, BILITOT   Microbiology: Recent Results (from the past 240 hour(s))  Culture, blood (routine x 2)     Status: None (Preliminary result)   Collection Time: 02/24/15  7:50 PM  Result Value Ref Range Status   Specimen Description BLOOD RIGHT HAND  Final   Special Requests BOTTLES DRAWN AEROBIC ONLY 5CC  Final   Culture NO GROWTH < 24 HOURS  Final   Report Status PENDING  Incomplete  Culture, blood (routine x 2)     Status: None (Preliminary result)  Collection Time: 02/24/15  7:59 PM  Result Value Ref Range Status   Specimen Description BLOOD LAC  Final   Special Requests IN PEDIATRIC BOTTLE 2CC  Final   Culture NO GROWTH < 24 HOURS  Final   Report Status PENDING  Incomplete    CHEST 2 VIEW 02/25/2015  COMPARISON: February 22, 2015 and February 23, 2015  FINDINGS: Lungs are mildly hyperexpanded. There has been interval resolution of interstitial pulmonary edema. There is a small left pleural effusion with mild left base atelectasis. Lungs elsewhere clear. Heart is upper normal in size with pulmonary vascularity within normal limits. No adenopathy. There is a small hiatal hernia. No bone lesions.  IMPRESSION: Small left pleural effusion with mild left base atelectasis. Interval resolution of interstitial edema. Small hiatal hernia. Heart upper normal in size with pulmonary vascularity within normal limits.   Electronically Signed  By: Lowella Grip III M.D.  On:  02/25/2015 10:07  ASSESSMENT: Dr. Nicki Reaper MacDiarmid will see her this afternoon for evaluation to determine options for managing her uterine prolapse and obstructive uropathy.  Her blood pressure remains poorly controlled on her current regimen of clonidine and carvedilol. Antihypertensive regimen is complicated further by her acute renal injury and intolerance to amlodipine. She also has developed what appears to be heart failure possibly due to to decompensated diastolic dysfunction and volume overload. That seems to have improved with a small dose of furosemide. We will consider inpatient cardiology evaluation.  She had one fever spike recently that resolved after starting empiric ceftriaxone. Blood cultures are negative. Urinalysis showed pyuria and hematuria. Unfortunately a urine culture was not done. We will continue ceftriaxone for now.  PLAN: 1. Urology evaluation 2. Consider cardiology evaluation 3. Continue clonidine and carvedilol for now 4. Monitor her blood pressure and renal function 5. Continue ceftriaxone    Mariah Bickers, MD [facility] [phone] pager   [phone] cell 02/26/2015, 2:12 PM

## 2015-02-27 DIAGNOSIS — N1330 Unspecified hydronephrosis: Secondary | ICD-10-CM

## 2015-02-27 DIAGNOSIS — Z01810 Encounter for preprocedural cardiovascular examination: Secondary | ICD-10-CM

## 2015-02-27 DIAGNOSIS — N139 Obstructive and reflux uropathy, unspecified: Secondary | ICD-10-CM

## 2015-02-27 LAB — CBC
HCT: 27.1 % — ABNORMAL LOW (ref 36.0–46.0)
Hemoglobin: 8.8 g/dL — ABNORMAL LOW (ref 12.0–15.0)
MCH: 28 pg (ref 26.0–34.0)
MCHC: 32.5 g/dL (ref 30.0–36.0)
MCV: 86.3 fL (ref 78.0–100.0)
PLATELETS: 256 10*3/uL (ref 150–400)
RBC: 3.14 MIL/uL — AB (ref 3.87–5.11)
RDW: 13.4 % (ref 11.5–15.5)
WBC: 5.8 10*3/uL (ref 4.0–10.5)

## 2015-02-27 LAB — RENAL FUNCTION PANEL
ALBUMIN: 2.4 g/dL — AB (ref 3.5–5.0)
Anion gap: 9 (ref 5–15)
BUN: 29 mg/dL — AB (ref 6–20)
CALCIUM: 8.2 mg/dL — AB (ref 8.9–10.3)
CO2: 25 mmol/L (ref 22–32)
CREATININE: 1.7 mg/dL — AB (ref 0.44–1.00)
Chloride: 104 mmol/L (ref 101–111)
GFR, EST AFRICAN AMERICAN: 33 mL/min — AB (ref >60)
GFR, EST NON AFRICAN AMERICAN: 29 mL/min — AB (ref >60)
Glucose, Bld: 102 mg/dL — ABNORMAL HIGH (ref 65–99)
PHOSPHORUS: 4.2 mg/dL (ref 2.5–4.6)
Potassium: 4.5 mmol/L (ref 3.5–5.1)
SODIUM: 138 mmol/L (ref 135–145)

## 2015-02-27 MED ORDER — CARVEDILOL 12.5 MG PO TABS
12.5000 mg | ORAL_TABLET | Freq: Two times a day (BID) | ORAL | Status: DC
  Administered 2015-02-27 – 2015-03-01 (×4): 12.5 mg via ORAL
  Filled 2015-02-27 (×4): qty 1

## 2015-02-27 NOTE — Progress Notes (Signed)
   Subjective: Patient says her breathing is improved and was breathing fine earlier today off of nasal cannula. Objective: Vital signs in last 24 hours: Filed Vitals:   02/26/15 1727 02/26/15 2012 02/27/15 0430 02/27/15 1033  BP: 182/65 172/62 132/59 177/47  Pulse: 64 74 69 62  Temp: 98.4 F (36.9 C) 98.4 F (36.9 C) 98.6 F (37 C)   TempSrc: Oral Oral Oral   Resp: 18 17 18    Height:      Weight:  119 lb (53.978 kg)    SpO2: 98% 99% 99% 99%   Weight change:   Intake/Output Summary (Last 24 hours) at 02/27/15 1602 Last data filed at 02/27/15 0800  Gross per 24 hour  Intake    870 ml  Output   1101 ml  Net   -231 ml   General: sitting in chair, NAD, foley in place Cardiac: RRR, no rubs, murmurs or gallops Pulm: decreased breath sounds, clear to auscultation bilaterally Abd: soft, nontender, nondistended Ext: warm and well perfused, no pedal edema Neuro: alert and oriented X3  Assessment/Plan: Principal Problem:   Obstructive uropathy Active Problems:   Essential hypertension   Acute kidney injury (HCC)   Bilateral hydronephrosis   Pulmonary vascular congestion   Fever   Normocytic anemia  Bilateral hydronephrosis with obstructive uropathy: Patient with chronic bilateral hydronephrosis suspected due to uterine prolapse. Creatinine 1.70 this morning. Dr. Matilde Sprang of Urology following who has seen patient yesterday. Understandably does not think patient good candidate for surgery at this time due to acute illness. Has considered bilateral percutaneous tube placement for improvement in renal health which may provide improvement in hypertension and fluid build up. -Continue current management, appreciate Urology rec's -monitor SCr -Continue Ceftriaxone for total of 7 day course with last day on 10/7.   Dyspnea/mild acute diastolic heart failure: Patient states breathing is improved, even off 2 L nasal cannula oxygen earlier today.  -Incentive spirometry -Lasix 20 mg  IV PRN for dyspnea, need to watch SCr -check pulse ox while ambulating -Cardiology consulted, appreciate rec's. Consider patient low risk for major cardiovascular complications with planned surgery -PT has seen patient with no further follow up recommended, O2 sats remained 92% or higher on room air  HTN: -Continue Clonidine 0.2 mg BID, -increase Coreg to 12.5 mg BID   Dispo: Disposition is deferred at this time, awaiting improvement of current medical problems.  Anticipated discharge in approximately 1-3 day(s).      LOS: 5 days   Zada Finders, MD 02/27/2015, 4:02 PM

## 2015-02-27 NOTE — Evaluation (Signed)
Physical Therapy Evaluation Patient Details Name: Mariah Phillips MRN: [id number] DOB: 09-Jan-1941 Today's Date: 02/27/2015   History of Present Illness  Admitted from cardiology office (was getting preop clearance for surgery for uterine prolapse) where she was found to have hypokalemia; Obstructive uropathy; DOE in ED;  has a past medical history of Hypertension; Uterine prolapse; Hydronephrosis; Esophageal reflux; Cystocele, midline; Old myocardial infarction; and Hyperlipidemia.  Clinical Impression   Patient evaluated by Physical Therapy with no further acute PT needs identified. All education has been completed and the patient has no further questions.  . PT is signing off. Thank you for this referral.   Encouraged daily walks in hallway   Follow Up Recommendations No PT follow up    Equipment Recommendations  None recommended by PT    Recommendations for Other Services       Precautions / Restrictions Precautions Precautions: None      Mobility  Bed Mobility                  Transfers Overall transfer level: Independent Equipment used: None                Ambulation/Gait Ambulation/Gait assistance: Supervision;Independent Ambulation Distance (Feet): 300 Feet Assistive device: None Gait Pattern/deviations: WFL(Within Functional Limits)     General Gait Details: Supervision progressing to modified independence; Cues to self-monitor for activiyt toelrance  Stairs            Wheelchair Mobility    Modified Rankin (Stroke Patients Only)       Balance                                             Pertinent Vitals/Pain Pain Assessment: No/denies pain    Home Living Family/patient expects to be discharged to:: Private residence Living Arrangements: Alone Available Help at Discharge: Family;Available PRN/intermittently Type of Home: House Home Access: Stairs to enter   [employer] of Steps: 2 Home Layout: One  level Home Equipment: None      Prior Function Level of Independence: Independent               Hand Dominance        Extremity/Trunk Assessment   Upper Extremity Assessment: Overall WFL for tasks assessed           Lower Extremity Assessment: Overall WFL for tasks assessed         Communication   Communication: No difficulties  Cognition Arousal/Alertness: Awake/alert Behavior During Therapy: WFL for tasks assessed/performed Overall Cognitive Status: Within Functional Limits for tasks assessed                      General Comments General comments (skin integrity, edema, etc.):  Session conducted on room air, and O2 sats remained greater tahn or equal to 92%    Exercises        Assessment/Plan    PT Assessment Patent does not need any further PT services  PT Diagnosis Generalized weakness   PT Problem List    PT Treatment Interventions     PT Goals (Current goals can be found in the Care Plan section) Acute Rehab PT Goals Patient Stated Goal: Hopes to be able to have her surgery PT Goal Formulation: All assessment and education complete, DC therapy    Frequency     Barriers to discharge  Co-evaluation               End of Session   Activity Tolerance: Patient tolerated treatment well Patient left: in chair;with call bell/phone within reach Nurse Communication: Mobility status         Time: 1523-1550 PT Time Calculation (min) (ACUTE ONLY): 27 min   Charges:   PT Evaluation $Initial PT Evaluation Tier I: 1 Procedure PT Treatments $Gait Training: 8-22 mins   PT G CodesQuin Hoop 02/27/2015, 4:24 PM  Roney [place], [place] Pager [phone] Office [phone]

## 2015-02-27 NOTE — Consult Note (Signed)
CARDIOLOGY CONSULT NOTE   Patient ID: Mariah Phillips MRN: [id number], DOB/AGE: [birth year]   Admit date: 02/22/2015 Date of Consult: 02/27/2015   Primary Physician: PROVIDER NOT IN SYSTEM Primary Cardiologist: Dr. Irish Lack  Pt. Profile  74 year old Caucasian female with past medical history of chronic uncontrolled hypertension, hyperlipidemia, and history of questionable MI in 1984 percent was acute diastolic heart failure, hypokalemia, bilateral severe hydronephrosis. Cardiology consulted for preoperative clearance. Noted, patient just recently cleared by Dr. Irish Lack a month ago.  Problem List  Past Medical History  Diagnosis Date  . Hypertension   . Uterine prolapse   . Hydronephrosis     bilateral  . Esophageal reflux   . Cystocele, midline   . Old myocardial infarction   . Hyperlipidemia     Past Surgical History  Procedure Laterality Date  . Cholecystectomy    . Cataract extraction  2000/2010     Allergies  Allergies  Allergen Reactions  . Amlodipine Swelling    Lower extremity edema    HPI   The patient is a 74 year old Caucasian female with past medical history of chronic uncontrolled hypertension, hyperlipidemia, and history of questionable MI in 1984. According to the patient, she underwent gallbladder surgery in 1984 and was told she had a small heart attack, however no invasive procedure a workup was done. Question maybe she had some mild troponin leak related to surgery. She established with our cardiology group in August 2016 as part of preoperative evaluation for uterine prolapse causing obstruction. Her urologist is Dr. Matilde Sprang. Per patient, she first learned of uterine prolapse in February, ever since, her symptom has been getting worse. She states it is also interfering with her blood pressure. She was previously well controlled on lisinopril hydrochlorothiazide combo prior to February for many years, however has since having problem managing her blood  pressure this year due to labile HTN. She has been followed up by hypertension clinic and prior to this admission, she is on Corag 6.5 mg twice a day, clonidine 0.2 mg twice a day, hydrochlorothiazide 25 mg in the morning, and lisinopril 20 mg at nighttime. She was seen in the office in August 2016 for preoperative clearance. She had echocardiogram on 01/23/2015 which showed EF 65-70%, no regional wall motion abnormality, grade 1 diastolic dysfunction, mild-to-moderate MR, moderate TR, PA peak pressure 45. Outpatient Myoview was obtained on 8/30, which showed EF 63%, normal myocardial perfusion with no ischemia. Outpatient carotid ultrasound did not reveal significant blockages. Patient was cleared on 01/25/2015 by Dr. Irish Lack to pursue her hysterectomy and urology procedure. Per outpatient pharmacist note, patient was previously on amlodipine at one point, however did not tolerate it due to peripheral edema. Otherwise she denies any recent chest pain, orthopnea or paroxysmal nocturnal dyspnea, she does have mild dyspnea on exertion but this has not interfered with her daily activity.  She presented to [facility] on 02/22/2015 after noted to have severe hypokalemia with K 2.2. On arrival, was also noted her O2 saturations in the 80s. Chest x-ray revealed mild pulmonary edema. Lab work showed acute on chronic renal insufficiency with Cr 2.03, whereas creatinine was 1.38 on 9/9. She also had low magnesium 1.2. Hemoglobin 9.5. EKG showed normal sinus rhythm without significant ST-T wave changes. Her HCTZ and lisinopril were held given hypokalemia and acute on chronic renal insufficiency. Initial ultrasound showed persistent severe bilateral hydronephrosis, cause of obstruction was felt to be secondary to uterine prolapse. She did have one episode of fever, however UA was  negative for nitrite. She was given antibiotic. Urology was consulted, it was noted she was previously scheduled to undergo surgical procedure  on 03/27/2015, however family wish to move the procedure up. Per urology note, he is likely not safe to do her prolapse surgery in the face of recent Illness, and also explained to her that bilateral percutaneous tubes may be necessary to maximize renal health and overall health. Echocardiogram was repeated during this admission which essentially showed no change compared to last echo except she has grade 2 diastolic dysfunction. Cardiology has been consulted for preoperative clearance.   Inpatient Medications  . carvedilol  12.5 mg Oral BID  . cefTRIAXone (ROCEPHIN)  IV  1 g Intravenous Q24H  . cloNIDine  0.2 mg Oral BID  . colchicine  0.3 mg Oral Daily  . diclofenac sodium  2 g Topical QID  . enoxaparin (LOVENOX) injection  30 mg Subcutaneous Q24H  . HYDROcodone-acetaminophen  1 tablet Oral Once  . ipratropium-albuterol  3 mL Nebulization Once  . oxybutynin  2.5 mg Oral BID  . pantoprazole  40 mg Oral Daily    Family History Family History  Problem Relation Age of Onset  . Thyroid cancer Mother   . Heart Problems Father      Social History Social History   Social History  . Marital Status: Widowed    Spouse Name: N/A  . Number of Children: N/A  . Years of Education: N/A   Occupational History  . Not on file.   Social History Main Topics  . Smoking status: Former Research scientist (life sciences)  . Smokeless tobacco: Not on file  . Alcohol Use: Not on file  . Drug Use: Not on file  . Sexual Activity: Not on file   Other Topics Concern  . Not on file   Social History Narrative     Review of Systems  General:  No chills, fever, night sweats or weight changes.  Cardiovascular:  No chest pain, orthopnea, palpitations, paroxysmal nocturnal dyspnea. +mild DOE, occasional LE edema Dermatological: No rash, lesions/masses Respiratory: No cough, dyspnea Urologic: No hematuria, dysuria Abdominal:   No nausea, vomiting, diarrhea, bright red blood per rectum, melena, or hematemesis Neurologic:  No  visual changes, wkns, changes in mental status. All other systems reviewed and are otherwise negative except as noted above.  Physical Exam  Blood pressure 177/47, pulse 62, temperature 98.6 F (37 C), temperature source Oral, resp. rate 18, height 4\' 7"  (1.397 m), weight 119 lb (53.978 kg), SpO2 99 %.  General: Pleasant, NAD Psych: Normal affect. Neuro: Alert and oriented X 3. Moves all extremities spontaneously. HEENT: Normal  Neck: Supple without bruits or JVD. Lungs:  Resp regular and unlabored. Markedly diminished breath on the bilateral bases, no wheezing, rhonchi,  rale Heart: RRR no s3, s4, or murmurs. Abdomen: Soft, non-tender, non-distended, BS + x 4.  Foley in place Extremities: No clubbing, cyanosis or edema. DP/PT/Radials 2+ and equal bilaterally.  Labs  No results for input(s): CKTOTAL, CKMB, TROPONINI in the last 72 hours. Lab Results  Component Value Date   WBC 5.8 02/27/2015   HGB 8.8* 02/27/2015   HCT 27.1* 02/27/2015   MCV 86.3 02/27/2015   PLT 256 02/27/2015     Recent Labs Lab 02/27/15 0710  NA 138  K 4.5  CL 104  CO2 25  BUN 29*  CREATININE 1.70*  CALCIUM 8.2*  GLUCOSE 102*   No results found for: CHOL, HDL, LDLCALC, TRIG No results found for: DDIMER  Radiology/Studies  Dg Chest 2 View  02/25/2015   CLINICAL DATA:  Shortness of Breath  EXAM: CHEST  2 VIEW  COMPARISON:  February 22, 2015 and February 23, 2015  FINDINGS: Lungs are mildly hyperexpanded. There has been interval resolution of interstitial pulmonary edema. There is a small left pleural effusion with mild left base atelectasis. Lungs elsewhere clear. Heart is upper normal in size with pulmonary vascularity within normal limits. No adenopathy. There is a small hiatal hernia. No bone lesions.  IMPRESSION: Small left pleural effusion with mild left base atelectasis. Interval resolution of interstitial edema. Small hiatal hernia. Heart upper normal in size with pulmonary vascularity within  normal limits.   Electronically Signed   By: Lowella Grip III M.D.   On: 02/25/2015 10:07   Dg Chest 2 View  02/22/2015   CLINICAL DATA:  Shortness of breath today.  EXAM: CHEST  2 VIEW  COMPARISON:  Single view of the chest 10/29/2007.  FINDINGS: There is cardiomegaly and mild interstitial edema. Kerley B-lines are noted. Hiatal hernia is seen. No pneumothorax or pleural effusion.  IMPRESSION: Cardiomegaly and interstitial pulmonary edema.  Hiatal hernia.   Electronically Signed   By: Inge Rise M.D.   On: 02/22/2015 14:58   US Renal  02/25/2015   CLINICAL DATA:  Follow-up known severe bilateral hydronephrosis. Subsequent encounter.  EXAM: RENAL / URINARY TRACT ULTRASOUND COMPLETE  COMPARISON:  Renal ultrasound performed 02/22/2015, and CT of the abdomen and pelvis performed 12/22/2014  FINDINGS: Right Kidney:  Length: 10.5 cm. Echogenicity within normal limits. No mass visualized. There is persistent severe chronic right-sided hydronephrosis.  Left Kidney:  Length: 12.2 cm. Echogenicity within normal limits. No mass visualized. There is persistent severe chronic left-sided hydronephrosis.  Bladder:  Not seen on this study. The known Foley catheter is not well characterized.  Small bilateral pleural effusions are seen.  IMPRESSION: 1. Persistent severe chronic bilateral hydronephrosis noted. This is unchanged from prior studies. 2. Small bilateral pleural effusions noted.   Electronically Signed   By: Garald Balding M.D.   On: 02/25/2015 00:37   US Renal  02/22/2015   CLINICAL DATA:  Abnormal renal function tests.  Hypoproteinemia.  EXAM: RENAL / URINARY TRACT ULTRASOUND COMPLETE  COMPARISON:  CT abdomen and pelvis 12/22/2014.  FINDINGS: Right Kidney:  Length: 9.5 cm. Echogenicity within normal limits. There is severe hydronephrosis as seen on the comparison examination. No mass visualized.  Left Kidney:  Length: 11.1 cm. Echogenicity within normal limits. There is severe hydronephrosis is seen  on the comparison examination. No mass visualized.  Bladder:  Appears normal for degree of bladder distention. Ureteral jets are not identified.  IMPRESSION: Persistent severe bilateral hydronephrosis. Cause for obstruction is not identified. Prior CT scan raised the possibility of obstruction secondary to pelvic floor laxity.   Electronically Signed   By: Inge Rise M.D.   On: 02/22/2015 15:46   Dg Chest Port 1 View  02/23/2015   CLINICAL DATA:  Short of breath  EXAM: PORTABLE CHEST 1 VIEW  COMPARISON:  02/22/2015  FINDINGS: Cardiac enlargement. Progressive vascular congestion and mild edema. Minimal left effusion. Mild bibasilar atelectasis.  IMPRESSION: Congestive heart failure with interstitial edema. Mild progression from yesterday.   Electronically Signed   By: Franchot Gallo M.D.   On: 02/23/2015 19:22    ECG  Normal sinus rhythm without significant ST-T wave changes.  ASSESSMENT AND PLAN  1. 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 She will ultimately need surgery to fix underlying issue. Her fluid overload symptom, hypokalemia, and to some degree her hypertension likely related to urological issue.  - No further cardiac workup is necessary.  2. Mild acute diastolic heart failure - resolved  - In some degree contributed by backup of fluid and obstructive uropathy, however also noted she has grade 2 diastolic dysfunction, and her cardiac chamber does not relax very well  - She is currently euvolemic, does have markedly diminished breath sound in bilateral basis, however no rale, rhonchi or wheezing. No lower extremity edema. Continue Foley drainage.  3. Hypokalemia likely related to bilateral hydronephrosis   4. Severe hydronephrosis bilateral related to #1  5. Hypertension: Lisinopril and HCTZ  on hold.  - Blood pressure not very well-controlled after holding lisinopril and HCTZ, will increase Coreg to 12.5 mg twice a day.  6. Fever of unknown origin  Signed, Almyra Deforest, PA-C 02/27/2015, 11:51 AM  I have seen and examined the patient along with Almyra Deforest, PA-C.  I have reviewed the chart, notes and new data.  I agree with PA's note.   PLAN: Extensive recent cardiac preop workup reviewed and I agree with Dr. Hassell Done recommendation that she is at low risk for major cardiovascular complications with the planned surgery. Also agree that she currently appears compensated from a CHF/volume standpoint and that recent fluid overload was related to obstructive uropathy (as is her hypokalemia). No plans for additional CV w/u or change in meds.  Sanda Klein, MD, [place] [phone] 02/27/2015, 12:23 PM

## 2015-02-28 DIAGNOSIS — N814 Uterovaginal prolapse, unspecified: Secondary | ICD-10-CM | POA: Diagnosis present

## 2015-02-28 LAB — BASIC METABOLIC PANEL
Anion gap: 11 (ref 5–15)
BUN: 30 mg/dL — AB (ref 6–20)
CALCIUM: 7.9 mg/dL — AB (ref 8.9–10.3)
CO2: 24 mmol/L (ref 22–32)
Chloride: 103 mmol/L (ref 101–111)
Creatinine, Ser: 1.75 mg/dL — ABNORMAL HIGH (ref 0.44–1.00)
GFR calc Af Amer: 32 mL/min — ABNORMAL LOW (ref >60)
GFR, EST NON AFRICAN AMERICAN: 28 mL/min — AB (ref >60)
GLUCOSE: 105 mg/dL — AB (ref 65–99)
Potassium: 4.5 mmol/L (ref 3.5–5.1)
Sodium: 138 mmol/L (ref 135–145)

## 2015-02-28 MED ORDER — IOHEXOL 300 MG/ML  SOLN: 25.0000 mL | Freq: Once | INTRAMUSCULAR | Status: DC | PRN

## 2015-02-28 MED ORDER — IOHEXOL 300 MG/ML  SOLN: 50.0000 mL | Freq: Once | INTRAMUSCULAR | Status: DC | PRN

## 2015-02-28 NOTE — Progress Notes (Signed)
Patient ID: [name], female   DOB: 1940-09-19, 74 y.o.   MRN: [id number]  Date: 02/28/2015  Patient name: Mariah Phillips  Medical record number: [id number]  Date of birth: 01/27/1941   This patient's plan of care was discussed with the house staff. Please see their note for complete details. I concur with their findings. I had 2 lengthy discussions again today with Ms. Mcneary and her daughter. I also spoke to Dr. Nicki Reaper MacDiarmid about this complex situation. She was reevaluated by cardiology yesterday who deemed her to be "low risk for major cardiovascular complications". However her blood pressure remains elevated. Her creatinine is 1.75, her hemoglobin is 8.8 and she is just recently recovering from diastolic heart failure. Her daughter will obtain recent laboratory results from her primary care provider in [place] and fax those to [place] for review.  Dr. Matilde Sprang informs me that she had a recent CT scan in his office which revealed some bowel thickening adjacent to her hiatal hernia. He is also concerned about her low hemoglobin that seems out of proportion for the degree and duration of her renal insufficiency. He has asked [place] to request a general surgery evaluation to see if there is any need for any further evaluation prior to considering major surgery for her uterine prolapse and hydronephrosis.  We will leave her Foley catheter in for now. Dr. Matilde Sprang is attempting to get in touch with Dr. Garwin Brothers, who would be performing the hysterectomy. He will also come by to speak with Ms. Reedy and her daughter later this afternoon. Mrs. Knape and her daughter are in agreement with this plan.   Michel Bickers, MD 02/28/2015, 12:33 PM

## 2015-02-28 NOTE — Progress Notes (Signed)
Patient & family concerned as to why patient is having an abdominal CT. Dr. Gordy Levan notified.

## 2015-02-28 NOTE — Progress Notes (Signed)
Reviewed chart/spoke with Dr Pennie Banter Spend re 30 mins with diagram with family and i think they are very well educated re problem and options Spoke to Dr Garwin Brothers getting on plan UNABLE to do any surgery until week after next I strongly recommended pre op PERC tubes to maximize medical health preop and family chose to decline; stents in my opinion not long enough and would need perc tubes Dr Garwin Brothers and I are willing to move surgery date up and will contact pt/family with date/instructions Leave catheter in to maximize renal drainage and send home with leg/night bag and instructions Please get general surgery consult re H Hernia and thickening and I explained this to family and left copy of CT with nursing station  Please call me at [phone] to discuss tomorrow or tonight Long note will be dictated in my office chart Thanks for looking after Mariah Phillips

## 2015-02-28 NOTE — Progress Notes (Signed)
Daughter in law wanted foley checked because patient complain that it was leaking.  RN check foley placement: good urine return noted in tubing & 10cc fluid noted in balloon.

## 2015-02-28 NOTE — Progress Notes (Addendum)
   Subjective: Patient says she is feeling well and is able to walk off of oxygen without issue or difficulty breathing. Objective: Vital signs in last 24 hours: Filed Vitals:   02/27/15 1700 02/27/15 2017 02/28/15 0420 02/28/15 0910  BP: 175/56 174/46 164/66 169/56  Pulse: 68 65 73 65  Temp: 98.9 F (37.2 C) 98.6 F (37 C) 98.2 F (36.8 C) 98.8 F (37.1 C)  TempSrc: Oral Oral Oral Oral  Resp: 16 18 19 18   Height:      Weight:  119 lb 7.8 oz (54.2 kg)    SpO2: 95% 96% 95% 98%   Weight change: 7.8 oz (0.222 kg)  Intake/Output Summary (Last 24 hours) at 02/28/15 1422 Last data filed at 02/28/15 0846  Gross per 24 hour  Intake    490 ml  Output   1250 ml  Net   -760 ml   General: sitting in chair, NAD Cardiac: RRR, no rubs, murmurs or gallops Pulm: decreased breath sounds, clear to auscultation bilaterally Abd: soft, nontender, nondistended GU: foley in place Ext: warm and well perfused, no pedal edema Neuro: alert and oriented X3  Assessment/Plan: Principal Problem:   Obstructive uropathy Active Problems:   Essential hypertension   Acute kidney injury (HCC)   Bilateral hydronephrosis   Normocytic anemia  Bilateral hydronephrosis with obstructive uropathy: Patient with chronic bilateral hydronephrosis suspected due to uterine prolapse. Dr. Matilde Sprang of Urology following. Understandably does not think patient good candidate for surgery at this time due to acute illness. Also reports previous CT shows that patient has some bowel thickening near her hiatal hernia. Is requesting [place] to consult surgery for evaluation on candidacy for procedure due to bowel thickening in addition to anemia. Has considered bilateral percutaneous tube placement for improvement in renal health which may provide improvement in hypertension and fluid build up and make patient better candidate for surgery. -Will speak with general surgery to review imaging and evaluate for surgical  candidacy -Continue current management, appreciate Urology rec's -Continue Ceftriaxone for total of 7 day course with last day on 10/7. (Empiric for suspected UTI with fever spike of 101)   Dyspnea/mild acute diastolic heart failure: Patient states breathing is improved. No SOB, DOE, or pedal edema. Ambulating on room air without issue. Lungs CTA. Her acute exacerbation seems to have resolved. -Incentive spirometry -Cardiology consulted, appreciate rec's. Consider patient low risk for major cardiovascular complications with planned surgery -PT has seen patient with no further follow up recommended, O2 sats remained 92% or higher on room air  HTN: -Continue Clonidine 0.2 mg BID, -Continue Coreg 12.5 mg BID  Normocytic anemia: Patient with Hgb b/w 8.3-9.7 during inpatient stay. Unknown baseline. Dr. Matilde Sprang reports prior CT showing thickened bowel wall near patient's hiatal hernia and concerned for any bleeding. Patient's daughter to send [place] prior medical records to assess baseline labs. MCV is normal, this may be an anemia of chronic disease. -Will speak with general surgery to review imaging and evaluate for surgical candidacy -Review records sent by patient's daughter to determine baseline status    Dispo: Disposition is deferred at this time, awaiting improvement of current medical problems.  Anticipated discharge in approximately 1-3 day(s).      LOS: 6 days   Zada Finders, MD 02/28/2015, 2:22 PM

## 2015-02-28 NOTE — Progress Notes (Signed)
Lab values and progress notes received from [place] [phone], Fax: [phone]) [phone].   The patient only had one Hgb drawn on 11/15/13   BP Cr Hgb  02/20/2015 187/79    01/04/2015 183/73    01/02/2015  1.18   12/30/2014 210/68    12/12/2014  1.30   12/02/2014 177/59    02/28/2014 136/49 0.77   11/15/2013  0.79 11/8  08/30/2013  0.87   03/03/2013 139/70    02/24/2013  0.86   03/16/2012  0.85

## 2015-03-01 DIAGNOSIS — I5031 Acute diastolic (congestive) heart failure: Secondary | ICD-10-CM

## 2015-03-01 DIAGNOSIS — N1339 Other hydronephrosis: Secondary | ICD-10-CM

## 2015-03-01 DIAGNOSIS — D649 Anemia, unspecified: Secondary | ICD-10-CM

## 2015-03-01 LAB — CULTURE, BLOOD (ROUTINE X 2)
CULTURE: NO GROWTH
Culture: NO GROWTH

## 2015-03-01 MED ORDER — CARVEDILOL 12.5 MG PO TABS: 12.5000 mg | ORAL_TABLET | Freq: Two times a day (BID) | ORAL | Status: DC

## 2015-03-01 MED ORDER — CLONIDINE HCL 0.2 MG PO TABS: 0.2000 mg | ORAL_TABLET | Freq: Two times a day (BID) | ORAL | Status: DC

## 2015-03-01 NOTE — Discharge Instructions (Signed)
Please continue to follow up with your primary care providers to assess your blood pressure, hemoglobin, and electrolyte levels as well as your breathing and fluid status.

## 2015-03-01 NOTE — Progress Notes (Signed)
Were asked by the teaching service at the request of Dr. Suzanne Boron Diarmid to review the CT scan which was completed over 2 months ago 12/22/14.  We can see the images and results in our PACS system in epic.  This does not give [place] accurate information about what may limit her surgical interventions at the current time.  We recommended yesterday getting a repeat CT abdomen/pelvis with Oral contrast only given her renal failure to see if her bowel wall thickening has improved.  We also asked to have baseline CBC so we know what her Hgb/Hct is at baseline.  If family and patient do not want a repeat CT scan then we don't have much to offer.  She apparently does not have any GERD symptoms, thus HH would not require intervention.  If she's having rectal bleeding then she may need colonoscopy and/or upper endoscopy to evaluate for blood loss, but again we do not have baseline in the system.  Hgb last checked 2 days ago and was 8.8.  Please call us back as needed.  Will discuss with Dr. Brantley Stage.   Jomarie Longs, PA-C General Surgery [facility] Surgery [phone]

## 2015-03-01 NOTE — Progress Notes (Signed)
03/01/2015  11:57 AM  Standard foley bag removed from urinary catheter system and leg bag applied.  Secured with straps below the knee.  Reviewed how to empty the bag as well as how to apply and reapply the leg bag as needed to the patient and the family.  Both asked questions appropriately and verbalized understanding of information.    Princella Pellegrini

## 2015-03-01 NOTE — Progress Notes (Signed)
Patient ID: Leala Bryand, female   DOB: 1940-10-20, 74 y.o.   MRN: [id number]  Date: 03/01/2015  Patient name: Mariah Phillips  Medical record number: [id number]  Date of birth: [birth year]   This patient's plan of care was discussed with the house staff. Please see their note for complete details. I concur with their findings. Mariah Phillips is feeling better. She has had no more shortness of breath when up walking. I reviewed the print copy of her CT scan from Dr. McDermott's office on 12/22/2014. It revealed a hiatal hernia was some thickening of the wall of the hernia. There is also a filling defect noted. The radiologist could not distinguish between food in the hernia or a mass. I discussed this with Mariah Phillips. She and her family were very firm in stating that they did not want a repeat CT scan or any further evaluation of this at this time. She is ready for discharge home on her current medications. She will followup with her primary care provider, Ailene Rud. The current plan is to try to arrange definitive surgery for hysterectomy and prolapse repair by Dr. Matilde Sprang and Dr. Garwin Brothers within the next 2 weeks.   Michel Bickers, MD 03/01/2015, 12:02 PM

## 2015-03-01 NOTE — Progress Notes (Addendum)
Subjective: No overnight changes. Patient states breathing is fine and able to walk without SOB. Objective: Vital signs in last 24 hours: Filed Vitals:   02/28/15 1741 02/28/15 2052 03/01/15 0354 03/01/15 0802  BP: 192/51 162/54 165/54 173/63  Pulse: 58 66 67 65  Temp: 98.9 F (37.2 C) 98 F (36.7 C) 98.3 F (36.8 C) 98.5 F (36.9 C)  TempSrc: Oral Oral Oral Oral  Resp: 16 17 18 18   Height:      Weight:  118 lb 9.7 oz (53.8 kg)    SpO2: 96% 98% 96% 98%   Weight change: -14.1 oz (-0.4 kg)  Intake/Output Summary (Last 24 hours) at 03/01/15 0930 Last data filed at 03/01/15 0601  Gross per 24 hour  Intake    290 ml  Output   2150 ml  Net  -1860 ml   General: sitting in chair, NAD Cardiac: RRR, 2/6 systolic murmur audible at LUSB Pulm: clear to auscultation bilaterally, no crackles or wheezing Abd: soft, nontender GU: foley in place Ext: warm and well perfused, no pedal edema Neuro: alert and oriented X3  Assessment/Plan: Principal Problem:   Obstructive uropathy Active Problems:   Hypertension, uncontrolled   Acute kidney injury ([place])   Bilateral hydronephrosis   Normocytic anemia   Uterine prolapse  Bilateral hydronephrosis with obstructive uropathy: Patient with chronic bilateral hydronephrosis suspected due to uterine prolapse. Dr. Matilde Sprang of Urology following. He has asked [place] to consult general surgery regarding her CT scan from 12/19/14 which showed hiatal hernia and bowel wall thickening. Surgery has requested repeat CT scan to evaluate if bowel wall thickening has improved, but patient and her daughter-in-law have declined to have further imaging done at this time. Without repeat CT, there is no further intervention from surgical point of view as patient without GERD symptoms which negates intervention for hiatal hernia. If further evaluation is required, will likely need to be done on outpatient basis.  -Will discharge today with foley in place and leg/night  bag with instructions on use -Patient to f/u with Dr. Matilde Sprang on further urological care -Discontinue Ceftriaxone. Patient has been treated for 5 days and without symptoms, should have good coverage at this time.  Dyspnea/mild acute diastolic heart failure: Patient breathing easily on room air. No SOB, DOE, or pedal edema. Lungs CTA. Her acute exacerbation seems to have resolved. -Cardiology has seen patient, appreciate rec's. Consider patient low risk for major cardiovascular complications with planned surgery -PT has seen patient with no further follow up recommended, O2 sats remained 92% or higher on room air -discharge today with close follow up to assess  HTN: Patient previously on Lisinopril and HCTZ. Lisinopril held due to elevated SCr and HCTZ held due to hypokalemia which has now corrected. Was on HCTZ 25 mg daily. Blood pressures still elevated, likely due to bilateral hydronephrosis and will be difficult to control until renal issues resolve. -Continue Clonidine 0.2 mg BID -Continue Coreg 12.5 mg BID -f/u with primary doctor for further BP medication management. May need BMP on outpatient follow up to assess potassium level if restarting HCTZ  Normocytic anemia: Patient with Hgb b/w 8.3-9.7 during inpatient stay with latest value of 8.8. Unknown baseline. Dr. Matilde Sprang reports prior CT showing thickened bowel wall near patient's hiatal hernia and concerned for any bleeding. Medical records provided from patient's clinic [facility]) show only one Hgb level of 11.8. Patient states she was on iron supplementation prior to hospitalization. Review of prior records show she was  started on ferrous sulfate 325 on 12/19/14 with TIBC of 384, Iron Sat 11, and Ferritin 36. No obvious signs of bleeding at this time and patient shows no signs of symptomatic anemia. -f/u with primary care team for possible re-initiation of iron supplementation with f/u CBC needed for Hgb  check  Dispo:  Anticipated discharge today.      LOS: 7 days   Zada Finders, MD 03/01/2015, 9:30 AM

## 2015-03-01 NOTE — Discharge Summary (Signed)
Name: Mariah Phillips MRN: [id number] DOB: 1941-02-25 74 y.o. PCP: Provider Not In System  Date of Admission: 02/22/2015  9:58 AM Date of Discharge: 03/01/2015 Attending Physician: No att. providers found  Discharge Diagnosis: 1. Mild acute diastolic heart failure Principal Problem:   Obstructive uropathy Active Problems:   Hypertension, uncontrolled   Acute kidney injury ([place])   Bilateral hydronephrosis   Normocytic anemia   Uterine prolapse  Discharge Medications:   Medication List    STOP taking these medications        hydrochlorothiazide 25 MG tablet  Commonly known as:  HYDRODIURIL     IRON PO     lisinopril 20 MG tablet  Commonly known as:  PRINIVIL,ZESTRIL      TAKE these medications        carvedilol 12.5 MG tablet  Commonly known as:  COREG  Take 1 tablet (12.5 mg total) by mouth 2 (two) times daily.     cloNIDine 0.2 MG tablet  Commonly known as:  CATAPRES  Take 1 tablet (0.2 mg total) by mouth 2 (two) times daily.     omeprazole 20 MG capsule  Commonly known as:  PRILOSEC  Take 1 capsule by mouth daily.     pravastatin 20 MG tablet  Commonly known as:  PRAVACHOL  Take 20 mg by mouth daily.        Disposition and follow-up:   Ms.Mariah Phillips was discharged from [facility] in Good condition.  At the hospital follow up visit please address:  Volume status: Please assess patient's volume status and any signs of fluid overload (breathing, weight gain, edema).  HTN: Patient's blood pressure difficult to control in setting of bilateral hydronephrosis. Her lisinopril was stopped due to kidney injury and HCTZ was held due to hypokalemia. Her clonidine was increased to 0.2 mg BID and Coreg to 12.5 mg daily. Can consider restarting HCTZ at lower 12.5 mg dose and monitor potassium.  Obstructive uropathy with bilateral hydronephrosis: Patient discharged with urinary catheter in place with leg bags. Please assess whether patient is able to  use catheter and bags properly and if placement is correct without leakage. She is to follow up with Dr. Matilde Sprang and Dr. Garwin Brothers for their planned surgery.  Anemia: Patient's Hgb was stable at 8-9 during hospital stay without any obvious active bleeding or symptoms. Please assess if she needs iron supplementation or other workup for cause of her anemia.  2.  Labs / imaging needed at time of follow-up: CBC for Hgb check, BMP for potassium  3.  Pending labs/ test needing follow-up: none  Follow-up Appointments: Follow-up Information    Follow up with MACDIARMID,SCOTT A, MD.   Specialty:  Urology   Contact information:   [place] [address] [phone]       Schedule an appointment as soon as possible for a visit with Ailene Rud, NP.   Specialty:  Family Medicine   Contact information:   [address] [place] [address] [facility] [place] [address] [phone]       Discharge Instructions: Discharge Instructions    ([place]) Call MD:  Anytime you have any of the following symptoms: 1) 3 pound weight gain in 24 hours or 5 pounds in 1 week 2) shortness of breath, with or without a dry hacking cough 3) swelling in the hands, feet or stomach 4) if you have to sleep on extra pillows at night in order  to breathe.    Complete by:  As directed      Call MD for:  difficulty breathing, headache or visual disturbances    Complete by:  As directed      Call MD for:  temperature >100.4    Complete by:  As directed      Diet - low sodium heart healthy    Complete by:  As directed      Increase activity slowly    Complete by:  As directed            Consultations: Treatment Team:  Bjorn Loser, MD Michae Kava Lbcardiology, MD Md Ccs, MD  Procedures Performed:  Dg Chest 2 View  02/25/2015   CLINICAL DATA:  Shortness of Breath  EXAM: CHEST  2 VIEW  COMPARISON:  February 22, 2015 and February 23, 2015  FINDINGS: Lungs are mildly  hyperexpanded. There has been interval resolution of interstitial pulmonary edema. There is a small left pleural effusion with mild left base atelectasis. Lungs elsewhere clear. Heart is upper normal in size with pulmonary vascularity within normal limits. No adenopathy. There is a small hiatal hernia. No bone lesions.  IMPRESSION: Small left pleural effusion with mild left base atelectasis. Interval resolution of interstitial edema. Small hiatal hernia. Heart upper normal in size with pulmonary vascularity within normal limits.   Electronically Signed   By: Lowella Grip III M.D.   On: 02/25/2015 10:07   Dg Chest 2 View  02/22/2015   CLINICAL DATA:  Shortness of breath today.  EXAM: CHEST  2 VIEW  COMPARISON:  Single view of the chest 10/29/2007.  FINDINGS: There is cardiomegaly and mild interstitial edema. Kerley B-lines are noted. Hiatal hernia is seen. No pneumothorax or pleural effusion.  IMPRESSION: Cardiomegaly and interstitial pulmonary edema.  Hiatal hernia.   Electronically Signed   By: Inge Rise M.D.   On: 02/22/2015 14:58   US Renal  02/25/2015   CLINICAL DATA:  Follow-up known severe bilateral hydronephrosis. Subsequent encounter.  EXAM: RENAL / URINARY TRACT ULTRASOUND COMPLETE  COMPARISON:  Renal ultrasound performed 02/22/2015, and CT of the abdomen and pelvis performed 12/22/2014  FINDINGS: Right Kidney:  Length: 10.5 cm. Echogenicity within normal limits. No mass visualized. There is persistent severe chronic right-sided hydronephrosis.  Left Kidney:  Length: 12.2 cm. Echogenicity within normal limits. No mass visualized. There is persistent severe chronic left-sided hydronephrosis.  Bladder:  Not seen on this study. The known Foley catheter is not well characterized.  Small bilateral pleural effusions are seen.  IMPRESSION: 1. Persistent severe chronic bilateral hydronephrosis noted. This is unchanged from prior studies. 2. Small bilateral pleural effusions noted.   Electronically  Signed   By: Garald Balding M.D.   On: 02/25/2015 00:37   US Renal  02/22/2015   CLINICAL DATA:  Abnormal renal function tests.  Hypoproteinemia.  EXAM: RENAL / URINARY TRACT ULTRASOUND COMPLETE  COMPARISON:  CT abdomen and pelvis 12/22/2014.  FINDINGS: Right Kidney:  Length: 9.5 cm. Echogenicity within normal limits. There is severe hydronephrosis as seen on the comparison examination. No mass visualized.  Left Kidney:  Length: 11.1 cm. Echogenicity within normal limits. There is severe hydronephrosis is seen on the comparison examination. No mass visualized.  Bladder:  Appears normal for degree of bladder distention. Ureteral jets are not identified.  IMPRESSION: Persistent severe bilateral hydronephrosis. Cause for obstruction is not identified. Prior CT scan raised the possibility of obstruction secondary to pelvic floor laxity.   Electronically Signed  By: Inge Rise M.D.   On: 02/22/2015 15:46   Dg Chest Port 1 View  02/23/2015   CLINICAL DATA:  Short of breath  EXAM: PORTABLE CHEST 1 VIEW  COMPARISON:  02/22/2015  FINDINGS: Cardiac enlargement. Progressive vascular congestion and mild edema. Minimal left effusion. Mild bibasilar atelectasis.  IMPRESSION: Congestive heart failure with interstitial edema. Mild progression from yesterday.   Electronically Signed   By: Franchot Gallo M.D.   On: 02/23/2015 19:22    2D Echo: 02/25/15 - Left ventricle: The cavity size was normal. Wall thickness was normal. Systolic function was normal. The estimated ejection fraction was in the range of 55% to 60%. Wall motion was normal; there were no regional wall motion abnormalities. Features are consistent with a pseudonormal left ventricular filling pattern, with concomitant abnormal relaxation and increased filling pressure (grade 2 diastolic dysfunction). - Aortic valve: Valve area (Vmax): 1.31 cm^2. - Mitral valve: There was mild to moderate regurgitation. - Left atrium: The atrium was  mildly dilated. - Pulmonary arteries: PA peak pressure: 40 mm Hg (S).  Cardiac Cath: n/a  Admission HPI: 74 year old woman with PMHx of MI in 1984, hypertension, hyperlipidemia, cystocele, uterine prolapse presenting to the ED after a preoperative visit with her cardiologist before a planned hysterectomy and bladder tack on 03/27/2015, where her potassium was noted to be 2.2. She has recently changed medications several times due to very resistant hypertension, most recently on lower dose of lisinopril and higher dose of clonidine. She reports having easy fatigability, increased leg swelling bilaterally, and intermittent tingling sensation in both arms. She has urinary retention and stress incontinence which are longstanding due to uterine prolapse. Otherwise she is feeling well with no decreased appetite, nausea, vomiting, or diarrhea.  After arrival to the ED she reports becoming dyspneic walking to the bathroom and back, and was noted on pulse oximetry to desaturate to 80%. She was placed on 3 L oxygen by nasal cannula and after returning to bed was in no distress. 2 view chest x-ray was obtained demonstrating diffuse mild pulmonary edema. Renal ultrasound was obtained demonstrating severe bilateral hydronephrosis. EKG was obtained showing normal sinus rhythm. Patient received 40 mEq KCl PO in ED.  Hospital Course by problem list:   Mild acute diastolic heart failure: Patient arrived with shortness of breath and interstitial pulmonary edema with mild acute diastolic heart failure. Was carefully diuresed with Lasix to avoid further renal dysfunction. Patient improved with diuresis and was able to ween of nasal cannula oxygen. She was able to ambulate with issue on room air satting equal to or greater than 92%. PT had no further recommendations for patient.  Hypertension: Patient's blood pressure difficult to control in setting of bilateral hydronephrosis. Her lisinopril was stopped due to kidney  injury/hypokalemia and HCTZ was held due to hypokalemia. Her clonidine was increased to 0.2 mg BID and Coreg to 12.5 mg daily. Potassium was replaced and stable on discharge. The hope is that her blood pressures will become more controllable once issue of obstructive uropathy with hydronephrosis is improved.  Obstructive uropathy with bilateral hydronephrosis: Patient with known uterine prolapse which is thought to be causing her obstruction and hydronephrosis. She was planned to have surgical correction of prolapse and hysterectomy on 03/27/15. She had a foley catheter placed and was making urine with good output. Cardiology was consulted and deemed her low risk for major cardiovascular complications from surgery. She is to follow up with Dr. Matilde Sprang and Dr. Garwin Brothers for their  planned surgery.  Discharge Vitals:   BP 173/63 mmHg  Pulse 65  Temp(Src) 98.5 F (36.9 C) (Oral)  Resp 18  Ht 4\' 7"  (1.397 m)  Wt 118 lb 9.7 oz (53.8 kg)  BMI 27.57 kg/m2  SpO2 98%  Discharge Labs:  No results found for this or any previous visit (from the past 24 hour(s)).  Signed: Zada Finders, MD 03/01/2015, 2:30 PM    Services Ordered on Discharge: none Equipment Ordered on Discharge: urinary catheter, leg bags

## 2015-03-01 NOTE — Progress Notes (Signed)
03/01/2015 12:25 PM  Reviewed discharge paperwork with patient and family.  Went over prescriptions, next doses of medications, follow-up appointments, as well as physician instructions.  Pt asked questions appropriately and verbalized understanding of material upon conclusion of session.  IV removed.  Leg bag remains intact.  Pt currently getting dressed and ready for discharge. Princella Pellegrini

## 2015-03-05 ENCOUNTER — Other Ambulatory Visit: Payer: Self-pay | Admitting: Urology

## 2015-03-12 ENCOUNTER — Other Ambulatory Visit: Payer: Self-pay | Admitting: Obstetrics and Gynecology

## 2015-03-21 NOTE — Patient Instructions (Addendum)
[place]  03/21/2015   Your procedure is scheduled on:   11-01--2016 Tuesday  Enter through [facility]  Entrance and follow signs to [facility]. Arrive at      0530  AM .  (Limit 1 person with you).  Call this number if you have problems the morning of surgery: [phone]  Or Presurgical Testing [phone].   For Living Will and/or Health Care Power Attorney Forms: please provide copy for your medical record,may bring AM of surgery(Forms should be already notarized -we do not provide this service).(03-22-15  No- information provided today with visit.).  Remember: Follow any bowel prep instructions per MD office.(1 Fleet enema  Bedtime night before) For Cpap use: Bring mask and tubing only.   Do not eat food/ or drink: After Midnight.  Exception: may have clear liquids:up to 6 Hours before arrival. Nothing after:  Clear liquids include soda, tea, black coffee, apple or grape juice, broth.  Take these medicines the morning of surgery with A SIP OF WATER-   (DO NOT TAKE ANY DIABETIC MEDS AM OF SURGERY) : Carvedilol. Clonidine. Omeprazole.   Do not wear jewelry, make-up or nail polish.  Do not wear deodorant, lotions, powders, or perfumes.   Do not shave legs and under arms- 48 hours(2 days) prior to first CHG shower.(Shaving face and neck okay.)  Do not bring valuables to the hospital.(Hospital is not responsible for lost valuables).  Contacts, dentures or removable bridgework, body piercing, hair pins may not be worn into surgery.  Leave suitcase in the car. After surgery it may be brought to your room.  For patients admitted to the hospital, checkout time is 11:00 AM the day of discharge.(Restricted visitors-Any Persons displaying flu-like symptoms or illness).    Patients discharged the day of surgery will not be allowed to drive home. Must have responsible person with you x 24 hours once discharged.  Name and phone number of your driver: Shaiann Mcmanamon-  daughter-in-law [phone] cell     Please read over the following fact sheets that you were given:  CHG(Chlorhexidine Gluconate 4% Surgical Soap) use, MRSA Information, Blood Transfusion fact sheet, Incentive Spirometry Instruction.  Remember : Type/Screen "Blue armbands" - may not be removed once applied(would result in being retested AM of surgery, if removed).         [place] - Preparing for Surgery Before surgery, you can play an important role.  Because skin is not sterile, your skin needs to be as free of germs as possible.  You can reduce the number of germs on your skin by washing with CHG (chlorahexidine gluconate) soap before surgery.  CHG is an antiseptic cleaner which kills germs and bonds with the skin to continue killing germs even after washing. Please DO NOT use if you have an allergy to CHG or antibacterial soaps.  If your skin becomes reddened/irritated stop using the CHG and inform your nurse when you arrive at Short Stay. Do not shave (including legs and underarms) for at least 48 hours prior to the first CHG shower.  You may shave your face/neck. Please follow these instructions carefully:  1.  Shower with CHG Soap the night before surgery and the  morning of Surgery.  2.  If you choose to wash your hair, wash your hair first as usual with your  normal  shampoo.  3.  After you shampoo, rinse your hair and body thoroughly to remove the  shampoo.  4.  Use CHG as you would any other liquid soap.  You can apply chg directly  to the skin and wash                       Gently with a scrungie or clean washcloth.  5.  Apply the CHG Soap to your body ONLY FROM THE NECK DOWN.   Do not use on face/ open                           Wound or open sores. Avoid contact with eyes, ears mouth and genitals (private parts).                       Wash face,  Genitals (private parts) with your normal soap.             6.  Wash thoroughly, paying special attention  to the area where your surgery  will be performed.  7.  Thoroughly rinse your body with warm water from the neck down.  8.  DO NOT shower/wash with your normal soap after using and rinsing off  the CHG Soap.                9.  Pat yourself dry with a clean towel.            10.  Wear clean pajamas.            11.  Place clean sheets on your bed the night of your first shower and do not  sleep with pets. Day of Surgery : Do not apply any lotions/deodorants the morning of surgery.  Please wear clean clothes to the hospital/surgery center.  FAILURE TO FOLLOW THESE INSTRUCTIONS MAY RESULT IN THE CANCELLATION OF YOUR SURGERY PATIENT SIGNATURE_________________________________  NURSE SIGNATURE__________________________________  ________________________________________________________________________

## 2015-03-22 ENCOUNTER — Encounter (HOSPITAL_COMMUNITY): Payer: Self-pay

## 2015-03-22 ENCOUNTER — Encounter (HOSPITAL_COMMUNITY)
Admission: RE | Admit: 2015-03-22 | Discharge: 2015-03-22 | Disposition: A | Discharge status: Home / Self Care | Payer: BLUE CROSS/BLUE SHIELD | Source: Ambulatory Visit | Attending: Urology | Admitting: Urology

## 2015-03-22 DIAGNOSIS — Z01818 Encounter for other preprocedural examination: Secondary | ICD-10-CM | POA: Diagnosis not present

## 2015-03-22 HISTORY — DX: Anemia, unspecified: D64.9

## 2015-03-22 HISTORY — DX: Edema, unspecified: R60.9

## 2015-03-22 HISTORY — DX: Presence of other specified devices: Z97.8

## 2015-03-22 HISTORY — DX: Presence of urogenital implants: Z96.0

## 2015-03-22 LAB — BASIC METABOLIC PANEL
Anion gap: 8 (ref 5–15)
BUN: 14 mg/dL (ref 6–20)
CALCIUM: 7.9 mg/dL — AB (ref 8.9–10.3)
CO2: 28 mmol/L (ref 22–32)
CREATININE: 1.08 mg/dL — AB (ref 0.44–1.00)
Chloride: 106 mmol/L (ref 101–111)
GFR calc Af Amer: 58 mL/min — ABNORMAL LOW (ref >60)
GFR, EST NON AFRICAN AMERICAN: 50 mL/min — AB (ref >60)
GLUCOSE: 116 mg/dL — AB (ref 65–99)
Potassium: 3.6 mmol/L (ref 3.5–5.1)
Sodium: 142 mmol/L (ref 135–145)

## 2015-03-22 LAB — CBC
HEMATOCRIT: 28 % — AB (ref 36.0–46.0)
HEMOGLOBIN: 9.1 g/dL — AB (ref 12.0–15.0)
MCH: 28 pg (ref 26.0–34.0)
MCHC: 32.5 g/dL (ref 30.0–36.0)
MCV: 86.2 fL (ref 78.0–100.0)
PLATELETS: 227 10*3/uL (ref 150–400)
RBC: 3.25 MIL/uL — ABNORMAL LOW (ref 3.87–5.11)
RDW: 13.7 % (ref 11.5–15.5)
WBC: 5.4 10*3/uL (ref 4.0–10.5)

## 2015-03-22 LAB — PROTIME-INR
INR: 1.1 (ref 0.00–1.49)
PROTHROMBIN TIME: 14.4 s (ref 11.6–15.2)

## 2015-03-22 LAB — ABO/RH: ABO/RH(D): A POS

## 2015-03-22 LAB — APTT: APTT: 31 s (ref 24–37)

## 2015-03-22 NOTE — Progress Notes (Signed)
Labs viewable in Epic, note Hgb.

## 2015-03-22 NOTE — Pre-Procedure Instructions (Signed)
03-22-15 1220 Note sent to Dr. Matilde Sprang and Dr. Garwin Brothers to note labs-Hgb.

## 2015-03-22 NOTE — Pre-Procedure Instructions (Signed)
EKG, Echo, Stress 10'16 Epic. Cxr 10'16.

## 2015-03-26 NOTE — H&P (Signed)
History of Present Illness   I was consulted by Mariah Phillips regarding Ms Mariah Phillips's prolapse, which has worsened since October. She feels vaginal bulging. She has not reduced it herself. She has not had a hysterectomy. Her bowel function is normal. She is prediabetic and has not had a stroke or back surgery. Her symptoms have not been medically treated.   She rarely leaks with coughing and giggling. She does not wear a pad. She voids 2-3 times a day. Her flow was a bit slower but may be improved in the morning. She has little to no nocturia.    When I saw Ms Mariah Phillips she had complete uterine prolapse and loss of a moderate amount of posterior length and all of her length anteriorly. She had reported that it worsened since her initial evaluation. She had a CT urogram, which showed impressive bilateral hydronephrosis. She had thickening in the area of a hiatal hernia, and I reviewed the findings with her. It was felt that it could be inflammation, but a tumor could not be ruled out and perhaps she would benefit from endoscopy. I want to order a BUN and creatinine.  She is here to discuss her urodynamics. On urodynamics she did not void and was catheterized for 100 mL. Her max capacity was 500 mL. Her bladder was unstable. Inserting a vaginal pack triggered instability. She had high-volume leakage. She had low pressure contractions at 500 mL reaching pressures of 9 cmH2O. She had mild leakage at 8 cmH2O at 30 mL. Her leak-point pressure is lowest at 8 cmH2O at 400 mL with mild leakage. She had no leakage prior to reduction of her prolapse. She voided with an interrupted flow pattern. She voided 464 mL with a max flow of 60 mL/second. She emptied efficiently. She may have had an obstructing component to her flow from the pack. EMG activity increased during the voiding phase. She had an interrupted pattern. Fluoroscopically, she had impressive cystocele with no reflux. The details of the urodynamics are signed  and dictated on the urodynamics sheet.    Past Medical History Problems  1. History of High cholesterol (E78.0) 2. History of esophageal reflux (Z87.19) 3. History of hypertension (Z86.79) 4. History of myocardial infarction (I25.2)  Surgical History Problems  1. History of No Surgical Problems  Current Meds 1. Lisinopril TABS;  Therapy: (Recorded:01Feb2016) to Recorded 2. Omeprazole 20 MG Oral Tablet Delayed Release;  Therapy: (Recorded:01Feb2016) to Recorded 3. Pravastatin Sodium 20 MG Oral Tablet;  Therapy: (Recorded:01Feb2016) to Recorded  Allergies Medication  1. No Known Drug Allergies  Family History Problems  1. Family history of Heart problem : Father  Social History Problems  1. Denied: History of Alcohol use 2. Daily caffeine consumption   1 - 2 per day 3. Death in the family, father   age 56 due to heart 4. Death in the family, mother   age 38 5. Employed   Agricultural consultant 6. Former smoker [phone])   smoked for 25 years, quit 32 years ago as of 06/26/14 office visit. 7. Widowed  Assessment Assessed  1. Uterovaginal prolapse (N81.4) 2. Female stress incontinence (N39.3)  Plan Hydronephrosis  1. BUN & CREATININE; Status:In Progress - Specimen/Data Collected;   Done: [id number] 2. VENIPUNCTURE; Status:Complete;   Done: [id number] Uterovaginal prolapse  3. Gynecology Referral Referral  Referral  Status: Hold For - Appointment,Records   Requested for: 04Aug2016  Discussion/Summary   Ms Mariah Phillips's creatinine was 0.78 in January 2016. In spite of recent labs in July 2016  did not see a BUN and creatinine, so we decided to get another one today. I will call if it is abnormal. I will consult Dr Servando Salina.  I drew her a picture. We talked about watchful waiting with reluctance, a pessary, and transvaginal hysterectomy with vault suspension, cystocele repair and graft, and possible posterior repair.  I drew her a picture and we talked about prolapse  surgery in detail. Pros, cons, general surgical and anesthetic risks, and other options including behavioral therapy, pessaries, and watchful waiting were discussed. She understands that prolapse repairs are successful in 80-85% of cases for prolapse symptoms and can recur anteriorly, posteriorly, and/or apically. She understands that in most cases I use a graft and general risks were discussed. Surgical risks were described but not limited to the discussion of injury to neighboring structures including the bowel (with possible life-threatening sepsis and colostomy), bladder, urethra, vagina (all resulting in further surgery), and ureter (resulting in re-implantation). We talked about injury to nerves/soft tissue leading to debilitating and intractable pelvic, abdominal, and lower extremity pain syndromes and neuropathies. The risks of buttock pain, intractable dyspareunia, and vaginal narrowing and shortening with sequelae were discussed. Bleeding risks, transfusion rates, and infection were discussed. The risk of persistent, de novo, or worsening bladder and/or bowel incontinence/dysfunction was discussed. The need for CIC was described as well the usual post-operative course. The patient understands that she might not reach her treatment goal and that she might be worse following surgery.  Conceptually, I do not think she should have sling since the risks of retention, etc, are much higher in this situation and unmasking stress incontinence was discussed. Mesh issues on TV were discussed. She is not sexually active.   Her labs and breathing status is much improved  After a thorough review of the management options for the patient's condition the patient  elected to proceed with surgical therapy as noted above. We have discussed the potential benefits and risks of the procedure, side effects of the proposed treatment, the likelihood of the patient achieving the goals of the procedure, and any potential  problems that might occur during the procedure or recuperation. Informed consent has been obtained.

## 2015-03-27 ENCOUNTER — Encounter (HOSPITAL_COMMUNITY): Payer: Self-pay | Admitting: *Deleted

## 2015-03-27 ENCOUNTER — Ambulatory Visit (HOSPITAL_COMMUNITY): Payer: BLUE CROSS/BLUE SHIELD | Admitting: Anesthesiology

## 2015-03-27 ENCOUNTER — Encounter (HOSPITAL_COMMUNITY)
Admission: RE | Disposition: A | Discharge status: Home / Self Care | Payer: Self-pay | Source: Ambulatory Visit | Attending: Urology

## 2015-03-27 ENCOUNTER — Observation Stay (HOSPITAL_COMMUNITY): Payer: BLUE CROSS/BLUE SHIELD

## 2015-03-27 ENCOUNTER — Observation Stay (HOSPITAL_COMMUNITY)
Admission: RE | Admit: 2015-03-27 | Discharge: 2015-03-29 | Disposition: A | Discharge status: Home / Self Care | Payer: BLUE CROSS/BLUE SHIELD | Source: Ambulatory Visit | Attending: Urology | Admitting: Urology

## 2015-03-27 DIAGNOSIS — I10 Essential (primary) hypertension: Secondary | ICD-10-CM | POA: Insufficient documentation

## 2015-03-27 DIAGNOSIS — IMO0002 Reserved for concepts with insufficient information to code with codable children: Secondary | ICD-10-CM

## 2015-03-27 DIAGNOSIS — J811 Chronic pulmonary edema: Secondary | ICD-10-CM | POA: Diagnosis not present

## 2015-03-27 DIAGNOSIS — Z888 Allergy status to other drugs, medicaments and biological substances status: Secondary | ICD-10-CM | POA: Diagnosis not present

## 2015-03-27 DIAGNOSIS — I5032 Chronic diastolic (congestive) heart failure: Secondary | ICD-10-CM | POA: Diagnosis not present

## 2015-03-27 DIAGNOSIS — D649 Anemia, unspecified: Secondary | ICD-10-CM | POA: Insufficient documentation

## 2015-03-27 DIAGNOSIS — N72 Inflammatory disease of cervix uteri: Secondary | ICD-10-CM | POA: Insufficient documentation

## 2015-03-27 DIAGNOSIS — I160 Hypertensive urgency: Secondary | ICD-10-CM | POA: Diagnosis not present

## 2015-03-27 DIAGNOSIS — E876 Hypokalemia: Secondary | ICD-10-CM | POA: Diagnosis not present

## 2015-03-27 DIAGNOSIS — K219 Gastro-esophageal reflux disease without esophagitis: Secondary | ICD-10-CM | POA: Diagnosis not present

## 2015-03-27 DIAGNOSIS — E7800 Pure hypercholesterolemia, unspecified: Secondary | ICD-10-CM | POA: Insufficient documentation

## 2015-03-27 DIAGNOSIS — N8110 Cystocele, unspecified: Secondary | ICD-10-CM | POA: Diagnosis present

## 2015-03-27 DIAGNOSIS — N816 Rectocele: Secondary | ICD-10-CM | POA: Diagnosis not present

## 2015-03-27 DIAGNOSIS — Z87891 Personal history of nicotine dependence: Secondary | ICD-10-CM | POA: Diagnosis not present

## 2015-03-27 DIAGNOSIS — N1330 Unspecified hydronephrosis: Secondary | ICD-10-CM | POA: Insufficient documentation

## 2015-03-27 DIAGNOSIS — I973 Postprocedural hypertension: Secondary | ICD-10-CM | POA: Insufficient documentation

## 2015-03-27 DIAGNOSIS — N813 Complete uterovaginal prolapse: Secondary | ICD-10-CM | POA: Diagnosis not present

## 2015-03-27 DIAGNOSIS — N83201 Unspecified ovarian cyst, right side: Secondary | ICD-10-CM | POA: Insufficient documentation

## 2015-03-27 DIAGNOSIS — I252 Old myocardial infarction: Secondary | ICD-10-CM | POA: Insufficient documentation

## 2015-03-27 DIAGNOSIS — D259 Leiomyoma of uterus, unspecified: Secondary | ICD-10-CM | POA: Diagnosis not present

## 2015-03-27 DIAGNOSIS — N393 Stress incontinence (female) (male): Secondary | ICD-10-CM | POA: Diagnosis not present

## 2015-03-27 DIAGNOSIS — N838 Other noninflammatory disorders of ovary, fallopian tube and broad ligament: Secondary | ICD-10-CM | POA: Insufficient documentation

## 2015-03-27 DIAGNOSIS — E785 Hyperlipidemia, unspecified: Secondary | ICD-10-CM | POA: Diagnosis not present

## 2015-03-27 DIAGNOSIS — N952 Postmenopausal atrophic vaginitis: Secondary | ICD-10-CM | POA: Diagnosis not present

## 2015-03-27 DIAGNOSIS — I110 Hypertensive heart disease with heart failure: Secondary | ICD-10-CM | POA: Insufficient documentation

## 2015-03-27 HISTORY — PX: VAGINAL PROLAPSE REPAIR: SHX830

## 2015-03-27 HISTORY — DX: Hypertensive urgency: I16.0

## 2015-03-27 HISTORY — PX: VAGINAL HYSTERECTOMY: SHX2639

## 2015-03-27 HISTORY — PX: SALPINGOOPHORECTOMY: SHX82

## 2015-03-27 HISTORY — PX: ANTERIOR AND POSTERIOR REPAIR: SHX5121

## 2015-03-27 HISTORY — DX: Reserved for concepts with insufficient information to code with codable children: IMO0002

## 2015-03-27 HISTORY — PX: CYSTOSCOPY WITH RETROGRADE URETHROGRAM: SHX6309

## 2015-03-27 LAB — BASIC METABOLIC PANEL
ANION GAP: 9 (ref 5–15)
BUN: 15 mg/dL (ref 6–20)
CALCIUM: 7.8 mg/dL — AB (ref 8.9–10.3)
CO2: 25 mmol/L (ref 22–32)
CREATININE: 1.08 mg/dL — AB (ref 0.44–1.00)
Chloride: 104 mmol/L (ref 101–111)
GFR calc Af Amer: 58 mL/min — ABNORMAL LOW (ref >60)
GFR, EST NON AFRICAN AMERICAN: 50 mL/min — AB (ref >60)
GLUCOSE: 152 mg/dL — AB (ref 65–99)
Potassium: 3 mmol/L — ABNORMAL LOW (ref 3.5–5.1)
Sodium: 138 mmol/L (ref 135–145)

## 2015-03-27 LAB — HEMOGLOBIN AND HEMATOCRIT, BLOOD
HCT: 28.2 % — ABNORMAL LOW (ref 36.0–46.0)
Hemoglobin: 9.5 g/dL — ABNORMAL LOW (ref 12.0–15.0)

## 2015-03-27 LAB — TYPE AND SCREEN
ABO/RH(D): A POS
Antibody Screen: NEGATIVE

## 2015-03-27 LAB — GLUCOSE, CAPILLARY: Glucose-Capillary: 223 mg/dL — ABNORMAL HIGH (ref 65–99)

## 2015-03-27 LAB — TROPONIN I

## 2015-03-27 SURGERY — ANTERIOR (CYSTOCELE) AND POSTERIOR REPAIR (RECTOCELE)
Anesthesia: General

## 2015-03-27 MED ORDER — FLEET ENEMA 7-19 GM/118ML RE ENEM: 1.0000 | ENEMA | Freq: Once | RECTAL | Status: DC

## 2015-03-27 MED ORDER — POLYMYXIN B SULFATE 500000 UNITS IJ SOLR
INTRAMUSCULAR | Status: AC
  Filled 2015-03-27: qty 1

## 2015-03-27 MED ORDER — DEXAMETHASONE SODIUM PHOSPHATE 10 MG/ML IJ SOLN
INTRAMUSCULAR | Status: DC | PRN
  Administered 2015-03-27: 10 mg via INTRAVENOUS

## 2015-03-27 MED ORDER — LIDOCAINE HCL (CARDIAC) 20 MG/ML IV SOLN
INTRAVENOUS | Status: AC
  Filled 2015-03-27: qty 5

## 2015-03-27 MED ORDER — MORPHINE SULFATE (PF) 2 MG/ML IV SOLN
2.0000 mg | INTRAVENOUS | Status: DC | PRN
  Administered 2015-03-27: 2 mg via INTRAVENOUS
  Filled 2015-03-27: qty 1

## 2015-03-27 MED ORDER — HYDROMORPHONE HCL 1 MG/ML IJ SOLN: 0.2500 mg | INTRAMUSCULAR | Status: DC | PRN

## 2015-03-27 MED ORDER — SUCCINYLCHOLINE CHLORIDE 20 MG/ML IJ SOLN
INTRAMUSCULAR | Status: DC | PRN
  Administered 2015-03-27: 80 mg via INTRAVENOUS

## 2015-03-27 MED ORDER — HYDROMORPHONE HCL 2 MG/ML IJ SOLN
INTRAMUSCULAR | Status: AC
  Filled 2015-03-27: qty 1

## 2015-03-27 MED ORDER — BELLADONNA ALKALOIDS-OPIUM 16.2-60 MG RE SUPP
RECTAL | Status: AC
  Filled 2015-03-27: qty 1

## 2015-03-27 MED ORDER — HYDRALAZINE HCL 20 MG/ML IJ SOLN
INTRAMUSCULAR | Status: AC
  Administered 2015-03-27: 10 mg
  Filled 2015-03-27: qty 1

## 2015-03-27 MED ORDER — POTASSIUM CHLORIDE CRYS ER 20 MEQ PO TBCR
20.0000 meq | EXTENDED_RELEASE_TABLET | Freq: Once | ORAL | Status: AC
  Administered 2015-03-27: 20 meq via ORAL
  Filled 2015-03-27: qty 1

## 2015-03-27 MED ORDER — POTASSIUM CHLORIDE 10 MEQ/50ML IV SOLN: 10.0000 meq | INTRAVENOUS | Status: DC

## 2015-03-27 MED ORDER — CEFAZOLIN SODIUM-DEXTROSE 2-3 GM-% IV SOLR
INTRAVENOUS | Status: AC
  Filled 2015-03-27: qty 50

## 2015-03-27 MED ORDER — PHENAZOPYRIDINE HCL 200 MG PO TABS
200.0000 mg | ORAL_TABLET | Freq: Once | ORAL | Status: AC
Start: 2015-03-27 — End: 2015-03-27
  Administered 2015-03-27: 200 mg via ORAL
  Filled 2015-03-27: qty 1

## 2015-03-27 MED ORDER — POTASSIUM CHLORIDE 10 MEQ/100ML IV SOLN
INTRAVENOUS | Status: AC
  Filled 2015-03-27: qty 100

## 2015-03-27 MED ORDER — HYDRALAZINE HCL 20 MG/ML IJ SOLN
3.0000 mg | Freq: Once | INTRAMUSCULAR | Status: AC
  Administered 2015-03-27: 3 mg via INTRAVENOUS

## 2015-03-27 MED ORDER — SODIUM CHLORIDE 0.9 % IR SOLN
Status: DC | PRN
  Administered 2015-03-27: 500 mL

## 2015-03-27 MED ORDER — HYDRALAZINE HCL 20 MG/ML IJ SOLN: 10.0000 mg | INTRAMUSCULAR | Status: DC | PRN

## 2015-03-27 MED ORDER — ROCURONIUM BROMIDE 100 MG/10ML IV SOLN
INTRAVENOUS | Status: AC
  Filled 2015-03-27: qty 1

## 2015-03-27 MED ORDER — HYDRALAZINE HCL 20 MG/ML IJ SOLN
INTRAMUSCULAR | Status: AC
  Filled 2015-03-27: qty 1

## 2015-03-27 MED ORDER — PANTOPRAZOLE SODIUM 40 MG PO TBEC
40.0000 mg | DELAYED_RELEASE_TABLET | Freq: Every day | ORAL | Status: DC
  Administered 2015-03-28 – 2015-03-29 (×2): 40 mg via ORAL
  Filled 2015-03-27 (×2): qty 1

## 2015-03-27 MED ORDER — LIDOCAINE-EPINEPHRINE (PF) 1 %-1:200000 IJ SOLN
INTRAMUSCULAR | Status: AC
  Filled 2015-03-27: qty 30

## 2015-03-27 MED ORDER — PRAVASTATIN SODIUM 20 MG PO TABS
20.0000 mg | ORAL_TABLET | Freq: Every evening | ORAL | Status: DC
  Administered 2015-03-27 – 2015-03-28 (×2): 20 mg via ORAL
  Filled 2015-03-27 (×4): qty 1

## 2015-03-27 MED ORDER — HYDROCODONE-ACETAMINOPHEN 5-325 MG PO TABS: 1.0000 | ORAL_TABLET | ORAL | Status: DC | PRN

## 2015-03-27 MED ORDER — LIDOCAINE HCL 2 % EX GEL
CUTANEOUS | Status: AC
  Filled 2015-03-27: qty 5

## 2015-03-27 MED ORDER — LIDOCAINE HCL (CARDIAC) 20 MG/ML IV SOLN
INTRAVENOUS | Status: DC | PRN
  Administered 2015-03-27: 50 mg via INTRAVENOUS

## 2015-03-27 MED ORDER — PROPOFOL 10 MG/ML IV BOLUS
INTRAVENOUS | Status: AC
  Filled 2015-03-27: qty 20

## 2015-03-27 MED ORDER — MIDAZOLAM HCL 5 MG/5ML IJ SOLN
INTRAMUSCULAR | Status: DC | PRN
  Administered 2015-03-27: 2 mg via INTRAVENOUS

## 2015-03-27 MED ORDER — ROCURONIUM BROMIDE 100 MG/10ML IV SOLN
INTRAVENOUS | Status: DC | PRN
  Administered 2015-03-27: 30 mg via INTRAVENOUS
  Administered 2015-03-27: 20 mg via INTRAVENOUS

## 2015-03-27 MED ORDER — ONDANSETRON HCL 4 MG/2ML IJ SOLN
4.0000 mg | INTRAMUSCULAR | Status: DC | PRN
  Administered 2015-03-27: 4 mg via INTRAVENOUS
  Filled 2015-03-27: qty 2

## 2015-03-27 MED ORDER — PROMETHAZINE HCL 25 MG/ML IJ SOLN
INTRAMUSCULAR | Status: AC
  Filled 2015-03-27: qty 1

## 2015-03-27 MED ORDER — ONDANSETRON HCL 4 MG/2ML IJ SOLN
INTRAMUSCULAR | Status: AC
  Filled 2015-03-27: qty 2

## 2015-03-27 MED ORDER — CLONIDINE HCL 0.2 MG PO TABS
0.2000 mg | ORAL_TABLET | Freq: Two times a day (BID) | ORAL | Status: DC
  Administered 2015-03-27 – 2015-03-29 (×5): 0.2 mg via ORAL
  Filled 2015-03-27 (×3): qty 1
  Filled 2015-03-27: qty 2
  Filled 2015-03-27: qty 1
  Filled 2015-03-27: qty 2

## 2015-03-27 MED ORDER — ESTRADIOL 0.1 MG/GM VA CREA
TOPICAL_CREAM | VAGINAL | Status: AC
Start: 2015-03-27 — End: 2015-03-27
  Filled 2015-03-27: qty 85

## 2015-03-27 MED ORDER — CIPROFLOXACIN IN D5W 400 MG/200ML IV SOLN
INTRAVENOUS | Status: AC
  Filled 2015-03-27: qty 200

## 2015-03-27 MED ORDER — CEFAZOLIN SODIUM-DEXTROSE 2-3 GM-% IV SOLR
2.0000 g | INTRAVENOUS | Status: AC
  Administered 2015-03-27: 2 g via INTRAVENOUS

## 2015-03-27 MED ORDER — SUGAMMADEX SODIUM 200 MG/2ML IV SOLN
INTRAVENOUS | Status: AC
  Filled 2015-03-27: qty 2

## 2015-03-27 MED ORDER — HYDROMORPHONE HCL 1 MG/ML IJ SOLN
INTRAMUSCULAR | Status: DC | PRN
  Administered 2015-03-27: .5 mg via INTRAVENOUS
  Administered 2015-03-27: 0.5 mg via INTRAVENOUS
  Administered 2015-03-27 (×2): .5 mg via INTRAVENOUS

## 2015-03-27 MED ORDER — PROMETHAZINE HCL 25 MG/ML IJ SOLN
6.2500 mg | INTRAMUSCULAR | Status: DC | PRN
  Administered 2015-03-27: 6.25 mg via INTRAVENOUS

## 2015-03-27 MED ORDER — DEXAMETHASONE SODIUM PHOSPHATE 10 MG/ML IJ SOLN
INTRAMUSCULAR | Status: AC
  Filled 2015-03-27: qty 1

## 2015-03-27 MED ORDER — KCL IN DEXTROSE-NACL 20-5-0.45 MEQ/L-%-% IV SOLN
INTRAVENOUS | Status: DC
Start: 2015-03-27 — End: 2015-03-29
  Administered 2015-03-27: 15:00:00 via INTRAVENOUS
  Filled 2015-03-27 (×3): qty 1000

## 2015-03-27 MED ORDER — METHYLENE BLUE 1 % INJ SOLN
INTRAMUSCULAR | Status: AC
  Filled 2015-03-27: qty 10

## 2015-03-27 MED ORDER — ONDANSETRON HCL 4 MG/2ML IJ SOLN
INTRAMUSCULAR | Status: DC | PRN
  Administered 2015-03-27: 4 mg via INTRAVENOUS

## 2015-03-27 MED ORDER — LIDOCAINE-EPINEPHRINE (PF) 1 %-1:200000 IJ SOLN
INTRAMUSCULAR | Status: DC | PRN
Start: 2015-03-27 — End: 2015-03-27
  Administered 2015-03-27: 20 mL
  Administered 2015-03-27: 17 mL

## 2015-03-27 MED ORDER — FENTANYL CITRATE (PF) 100 MCG/2ML IJ SOLN
INTRAMUSCULAR | Status: DC | PRN
  Administered 2015-03-27 (×3): 50 ug via INTRAVENOUS
  Administered 2015-03-27: 100 ug via INTRAVENOUS

## 2015-03-27 MED ORDER — ACETAMINOPHEN 325 MG PO TABS
650.0000 mg | ORAL_TABLET | ORAL | Status: DC | PRN
  Administered 2015-03-28: 650 mg via ORAL
  Filled 2015-03-27: qty 2

## 2015-03-27 MED ORDER — MIDAZOLAM HCL 2 MG/2ML IJ SOLN
INTRAMUSCULAR | Status: AC
  Filled 2015-03-27: qty 4

## 2015-03-27 MED ORDER — POTASSIUM CHLORIDE 10 MEQ/100ML IV SOLN
10.0000 meq | INTRAVENOUS | Status: AC
  Administered 2015-03-27 (×2): 10 meq via INTRAVENOUS

## 2015-03-27 MED ORDER — FENTANYL CITRATE (PF) 250 MCG/5ML IJ SOLN
INTRAMUSCULAR | Status: AC
  Filled 2015-03-27: qty 25

## 2015-03-27 MED ORDER — CIPROFLOXACIN IN D5W 400 MG/200ML IV SOLN
400.0000 mg | INTRAVENOUS | Status: AC
  Administered 2015-03-27: 400 mg via INTRAVENOUS

## 2015-03-27 MED ORDER — PROPOFOL 10 MG/ML IV BOLUS
INTRAVENOUS | Status: DC | PRN
  Administered 2015-03-27: 90 mg via INTRAVENOUS

## 2015-03-27 MED ORDER — SODIUM CHLORIDE 0.9 % IR SOLN
Status: DC | PRN
  Administered 2015-03-27: 3000 mL

## 2015-03-27 MED ORDER — CARVEDILOL 12.5 MG PO TABS
12.5000 mg | ORAL_TABLET | Freq: Two times a day (BID) | ORAL | Status: DC
  Administered 2015-03-27 – 2015-03-29 (×4): 12.5 mg via ORAL
  Filled 2015-03-27 (×8): qty 1

## 2015-03-27 MED ORDER — LACTATED RINGERS IV SOLN
INTRAVENOUS | Status: DC | PRN
  Administered 2015-03-27 (×3): via INTRAVENOUS

## 2015-03-27 MED ORDER — SUGAMMADEX SODIUM 200 MG/2ML IV SOLN
INTRAVENOUS | Status: DC | PRN
  Administered 2015-03-27: 102.6 mg via INTRAVENOUS

## 2015-03-27 SURGICAL SUPPLY — 73 items
ALLOGRAFT TUTOPLAST AXIS 6X12 (Tissue) ×2 IMPLANT
BAG URINE DRAINAGE (UROLOGICAL SUPPLIES) ×4 IMPLANT
BAG URO CATCHER STRL LF (DRAPE) ×4 IMPLANT
BASKET ZERO TIP NITINOL 2.4FR (BASKET) IMPLANT
BLADE HEX COATED 2.75 (ELECTRODE) ×4 IMPLANT
BLADE SURG 15 STRL LF DISP TIS (BLADE) ×4 IMPLANT
BLADE SURG 15 STRL SS (BLADE) ×4
CATH FOLEY 2WAY SLVR  5CC 14FR (CATHETERS) ×2
CATH FOLEY 2WAY SLVR  5CC 16FR (CATHETERS)
CATH FOLEY 2WAY SLVR 5CC 14FR (CATHETERS) ×2 IMPLANT
CATH FOLEY 2WAY SLVR 5CC 16FR (CATHETERS) IMPLANT
COVER MAYO STAND STRL (DRAPES) IMPLANT
COVER SURGICAL LIGHT HANDLE (MISCELLANEOUS) ×4 IMPLANT
DECANTER SPIKE VIAL GLASS SM (MISCELLANEOUS) ×4 IMPLANT
DEVICE CAPIO SLIM BOX (INSTRUMENTS) ×4 IMPLANT
DEVICE CAPIO SLIM SINGLE (INSTRUMENTS) IMPLANT
DRAIN PENROSE 18X1/4 LTX STRL (WOUND CARE) ×4 IMPLANT
ELECT PENCIL ROCKER SW 15FT (MISCELLANEOUS) ×4 IMPLANT
ELECT REM PT RETURN 9FT ADLT (ELECTROSURGICAL) ×4
ELECTRODE REM PT RTRN 9FT ADLT (ELECTROSURGICAL) ×2 IMPLANT
GAUZE PACKING 2X5 YD STRL (GAUZE/BANDAGES/DRESSINGS) ×4 IMPLANT
GAUZE SPONGE 4X4 16PLY XRAY LF (GAUZE/BANDAGES/DRESSINGS) ×8 IMPLANT
GLOVE BIO SURGEON STRL SZ 6.5 (GLOVE) ×6 IMPLANT
GLOVE BIO SURGEON STRL SZ7 (GLOVE) ×4 IMPLANT
GLOVE BIO SURGEONS STRL SZ 6.5 (GLOVE) ×2
GLOVE BIOGEL M STRL SZ7.5 (GLOVE) ×4 IMPLANT
GLOVE ECLIPSE 8.5 STRL (GLOVE) ×16 IMPLANT
GOWN STRL REUS W/TWL LRG LVL3 (GOWN DISPOSABLE) ×8 IMPLANT
GOWN STRL REUS W/TWL XL LVL3 (GOWN DISPOSABLE) ×8 IMPLANT
GUIDEWIRE ANG ZIPWIRE 038X150 (WIRE) IMPLANT
GUIDEWIRE STR DUAL SENSOR (WIRE) ×4 IMPLANT
HOLDER FOLEY CATH W/STRAP (MISCELLANEOUS) ×4 IMPLANT
IV NS 1000ML (IV SOLUTION) ×2
IV NS 1000ML BAXH (IV SOLUTION) ×2 IMPLANT
KIT BASIN OR (CUSTOM PROCEDURE TRAY) ×4 IMPLANT
NEEDLE HYPO 22GX1.5 SAFETY (NEEDLE) IMPLANT
NEEDLE MAYO 6 CRC TAPER PT (NEEDLE) ×4 IMPLANT
NS IRRIG 1000ML POUR BTL (IV SOLUTION) ×4 IMPLANT
PACK CYSTO (CUSTOM PROCEDURE TRAY) ×4 IMPLANT
PACKING VAGINAL (PACKING) ×4 IMPLANT
PLUG CATH AND CAP STER (CATHETERS) ×4 IMPLANT
POSITIONER SURGICAL ARM (MISCELLANEOUS) ×4 IMPLANT
RETRACTOR STAY HOOK 5MM (MISCELLANEOUS) ×4 IMPLANT
SHEET LAVH (DRAPES) ×4 IMPLANT
SPONGE LAP 4X18 X RAY DECT (DISPOSABLE) ×4 IMPLANT
SUT CAPIO ETHIBPND (SUTURE) ×8 IMPLANT
SUT SILK 2 0 SH (SUTURE) ×4 IMPLANT
SUT VIC AB 0 CT1 18XCR BRD 8 (SUTURE) ×2 IMPLANT
SUT VIC AB 0 CT1 27 (SUTURE) ×2
SUT VIC AB 0 CT1 27XBRD ANTBC (SUTURE) ×2 IMPLANT
SUT VIC AB 0 CT1 36 (SUTURE) ×4 IMPLANT
SUT VIC AB 0 CT1 8-18 (SUTURE) ×2
SUT VIC AB 2-0 CT1 27 (SUTURE) ×4
SUT VIC AB 2-0 CT1 27XBRD (SUTURE) ×4 IMPLANT
SUT VIC AB 2-0 SH 27 (SUTURE) ×6
SUT VIC AB 2-0 SH 27X BRD (SUTURE) ×6 IMPLANT
SUT VIC AB 3-0 CT1 27 (SUTURE) ×2
SUT VIC AB 3-0 CT1 TAPERPNT 27 (SUTURE) ×2 IMPLANT
SUT VIC AB 3-0 SH 27 (SUTURE) ×24
SUT VIC AB 3-0 SH 27XBRD (SUTURE) ×24 IMPLANT
SUT VICRYL 0 TIES 12 18 (SUTURE) ×4 IMPLANT
SUT VICRYL 0 UR6 27IN ABS (SUTURE) ×20 IMPLANT
SYR 20CC LL (SYRINGE) ×4 IMPLANT
SYRINGE 10CC LL (SYRINGE) ×4 IMPLANT
TOWEL OR 17X26 10 PK STRL BLUE (TOWEL DISPOSABLE) ×4 IMPLANT
TOWEL OR NON WOVEN STRL DISP B (DISPOSABLE) ×4 IMPLANT
TUBING CONNECTING 10 (TUBING) ×6 IMPLANT
TUBING CONNECTING 10' (TUBING) ×2
TUTOPLAST AXIS 6X12 (Tissue) ×4 IMPLANT
WATER STERILE IRR 1500ML POUR (IV SOLUTION) ×4 IMPLANT
WATER STERILE IRR 500ML POUR (IV SOLUTION) ×4 IMPLANT
WIRE COONS/BENSON .038X145CM (WIRE) ×4 IMPLANT
YANKAUER SUCT BULB TIP 10FT TU (MISCELLANEOUS) ×4 IMPLANT

## 2015-03-27 NOTE — H&P (Signed)
Mariah Phillips is an 74 y.o. 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 Patient also has bilateral hydronephrosis. She has a cystocele and will be undergoing vault suspension, cystocele repair by Dr Matilde Sprang. PMH notable for chronic HTN, bilateral hydronephrosis  Pertinent Gynecological History: Menses: post-menopausal Bleeding: none Contraception: none DES exposure: unknown Blood transfusions: none Sexually transmitted diseases: no past history Previous GYN Procedures: tl  Last mammogram: normal Date: 2016 Last pap: normal Date: 2016 OB History: G3, P2012   Menstrual History: Menarche age: n/a No LMP recorded. Patient is postmenopausal.    Past Medical History  Diagnosis Date  . Hypertension   . Uterine prolapse   . Hydronephrosis     bilateral  . Esophageal reflux   . Cystocele, midline   . Old myocardial infarction   . Hyperlipidemia   . Anemia     no blood transfusions in past  . Edema     lower extremities  . Foley catheter in place     hx. urinary retention 03-22-15 Foley cath remains in place.    Past Surgical History  Procedure Laterality Date  . Cataract extraction  2000/2010  . Tubal ligation    . Cholecystectomy      '84-open  . Eye surgery      retina surgery 2012  . Upper gastrointestinal endoscopy      with esophageal dilation    Family History  Problem Relation Age of Onset  . Thyroid cancer Mother   . Heart Problems Father     Social History:  reports that she quit smoking about 32 years ago. She has never used smokeless tobacco. She reports that she does not drink alcohol or use illicit drugs.  Allergies:  Allergies  Allergen Reactions  . Amlodipine Swelling    Lower extremity edema  no latex allergy  Med: carvediol 12.5mg  Clonidine 0.2mg  tab Omeprazole 20mg  po qd pravastin 20mg  po qd   Review of Systems  All other systems reviewed and are negative.   There were no vitals taken for this  visit. Physical Exam  Constitutional: She is oriented to person, place, and time. She appears well-developed and well-nourished.  petite  HENT:  Head: Atraumatic.  Neck: Neck supple.  Cardiovascular: Normal rate.   No murmur heard. Respiratory: Effort normal.  GI: Soft. Bowel sounds are normal.  CCY scar  Genitourinary: Uterus normal.  Neurological: She is alert and oriented to person, place, and time.  Skin: Skin is warm and dry.  Psychiatric: She has a normal mood and affect.   Vulva atrophic Vagina: atrophic mucosa Cervix; parous, dry Uterus procidentia, nl uterus Adnexa nonpalpable  CBC    Component Value Date/Time   WBC 5.4 03/22/2015 1110   RBC 3.25* 03/22/2015 1110   HGB 9.1* 03/22/2015 1110   HCT 28.0* 03/22/2015 1110   PLT 227 03/22/2015 1110   MCV 86.2 03/22/2015 1110   MCH 28.0 03/22/2015 1110   MCHC 32.5 03/22/2015 1110   RDW 13.7 03/22/2015 1110   LYMPHSABS 1.1 02/25/2015 0855   MONOABS 0.5 02/25/2015 0855   EOSABS 0.2 02/25/2015 0855   BASOSABS 0.0 02/25/2015 0855   BMET    Component Value Date/Time   NA 142 03/22/2015 1110   K 3.6 03/22/2015 1110   CL 106 03/22/2015 1110   CO2 28 03/22/2015 1110   GLUCOSE 116* 03/22/2015 1110   BUN 14 03/22/2015 1110   CREATININE 1.08* 03/22/2015 1110   CALCIUM 7.9* 03/22/2015 1110   GFRNONAA 50* 03/22/2015  1110   GFRAA 58* 03/22/2015 1110      Assessment/Plan: Procidentia Cystocele Chronic HTN Bilateral hydronephrosis P) TVHBSO. Procedure explained. Risk reviewed including infection, bleeding, injury to surrounding organ structures, thermal injury , inability to reach adnexa, poss need to covert to open case, bowel obstruction, possible need for surgery in the future due to ovarian disease, possible blood transfusion and its risk. All ? answered  Mariah Phillips A 03/27/2015, 4:52 AM

## 2015-03-27 NOTE — Anesthesia Procedure Notes (Signed)
Procedure Name: Intubation Date/Time: 03/27/2015 7:48 AM Performed by: Noralyn Pick D Pre-anesthesia Checklist: Patient identified, Emergency Drugs available, Suction available and Patient being monitored Patient Re-evaluated:Patient Re-evaluated prior to inductionOxygen Delivery Method: Circle system utilized Preoxygenation: Pre-oxygenation with 100% oxygen Intubation Type: IV induction Ventilation: Mask ventilation without difficulty Laryngoscope Size: Mac and 3 Grade View: Grade II Tube type: Oral Tube size: 7.0 mm Number of attempts: 1 Airway Equipment and Method: Stylet Placement Confirmation: ETT inserted through vocal cords under direct vision,  positive ETCO2 and breath sounds checked- equal and bilateral Secured at: 20 cm Tube secured with: Tape Dental Injury: Teeth and Oropharynx as per pre-operative assessment

## 2015-03-27 NOTE — Interval H&P Note (Signed)
History and Physical Interval Note:  03/27/2015 7:18 AM  Mariah Phillips  has presented today for surgery, with the diagnosis of [place] URINARY INCONTINENCE  The various methods of treatment have been discussed with the patient and family. After consideration of risks, benefits and other options for treatment, the patient has consented to  Procedure(s): ANTERIOR (CYSTOCELE) AND POSTERIOR REPAIR (RECTOCELE) (N/A) VAGINAL VAULT SUSPENSION AND GRAFT (N/A) CYSTOSCOPY WITH POSSIBLE BILATERAL STENTS AND  RETROGRADE URETHROGRAM (Bilateral) HYSTERECTOMY VAGINAL (N/A) SALPINGO OOPHORECTOMY (Bilateral) as a surgical intervention .  The patient's history has been reviewed, patient examined, no change in status, stable for surgery.  I have reviewed the patient's chart and labs.  Questions were answered to the patient's satisfaction.     Kylle Lall A

## 2015-03-27 NOTE — Consult Note (Signed)
Name: Mariah Phillips MRN: [id number] DOB: 1940/07/02    ADMISSION DATE:  03/27/2015 CONSULTATION DATE:  11/1  REFERRING MD :  MacDiarmid   CHIEF COMPLAINT:  HTN management   BRIEF PATIENT DESCRIPTION:  This is a 74 year old female who had recently been discharged from the hospital on 10/6 after going to her cardiologist for pre-op evaluation for planned hysterectomy and bladder tack. During this visit she was found to have increased exertional dyspnea, LE swelling, fatigue and HTN so she was admitted for in-patient titration her anti-hypertensive medications. During this evaluation she was found to have: diastolic dysfunction, AKI, and associated dyspnea due to pulmonary edema. She was diuresed and her BP regimen was adjusted, then eventually discharged to home on Clonidine and carvedilol. She reported for her elective urologic procedure on 11/1. Intra op was > 2 liters +. Did has some mild intra-op HTN w/ SBP ~284mmHg which was treated w/ 3mg  hydralazine IV. Her BP improved to 150s, she recovered to PACU then to the medical ward. Shortly after arrival to the med/surg unit her BP was in 220s. She was transferred to the ICU for PCCM to further assist w/ her HTN      SIGNIFICANT EVENTS  11/1:Vault prolapse repair and cystocele repair and graft and cystoscopy/ hysterectomy/  preop she did take her clonidine and carvedilol this am   STUDIES:  Echo 10/2: EF 55-60%; left ventricular filling pattern,with concomitant abnormal relaxation and increased filling pressure (grade 2 diastolic dysfunction).   HISTORY OF PRESENT ILLNESS:  See above   PAST MEDICAL HISTORY :   has a past medical history of Hypertension; Uterine prolapse; Hydronephrosis; Esophageal reflux; Cystocele, midline; Old myocardial infarction; Hyperlipidemia; Anemia; Edema; and Foley catheter in place.  has past surgical history that includes Cataract extraction (2000/2010); Tubal ligation; Cholecystectomy; Eye surgery; and Upper  gastrointestinal endoscopy. Prior to Admission medications   Medication Sig Start Date End Date Taking? Authorizing Provider  carvedilol (COREG) 12.5 MG tablet Take 1 tablet (12.5 mg total) by mouth 2 (two) times daily. 03/01/15  Yes Zada Finders, MD  cloNIDine (CATAPRES) 0.2 MG tablet Take 1 tablet (0.2 mg total) by mouth 2 (two) times daily. 03/01/15  Yes Zada Finders, MD  omeprazole (PRILOSEC) 20 MG capsule Take 1 capsule by mouth every morning.  01/09/15  Yes Historical Provider, MD  pravastatin (PRAVACHOL) 20 MG tablet Take 20 mg by mouth every evening.    Yes Historical Provider, MD   Allergies  Allergen Reactions  . Amlodipine Swelling    Lower extremity edema    FAMILY HISTORY:  family history includes Heart Problems in her father; Thyroid cancer in her mother. SOCIAL HISTORY:  reports that she quit smoking about 32 years ago. She has never used smokeless tobacco. She reports that she does not drink alcohol or use illicit drugs.  REVIEW OF SYSTEMS:   Constitutional: Negative for fever, chills, weight loss, malaise/fatigue and diaphoresis.  HENT: Negative for hearing loss, ear pain, nosebleeds, congestion, sore throat, neck pain, tinnitus and ear discharge.   Eyes: Negative for blurred vision, double vision, photophobia, pain, discharge and redness.  Respiratory: Negative for cough, hemoptysis, sputum production, shortness of breath, wheezing and stridor.   Cardiovascular: Negative for chest pain, palpitations, orthopnea, claudication, leg swelling and PND.  Gastrointestinal: Negative for heartburn, nausea, vomiting, abdominal pain, diarrhea, constipation, blood in stool and melena.  Genitourinary: Negative for dysuria, urgency, frequency, hematuria and flank pain.  Musculoskeletal: Negative for myalgias, back pain, joint pain and falls.  Skin: Negative for itching and rash.  Neurological: Negative for dizziness, tingling, tremors, sensory change, speech change, focal weakness,  seizures, loss of consciousness, weakness and headaches.  Endo/Heme/Allergies: Negative for environmental allergies and polydipsia. Does not bruise/bleed easily.  SUBJECTIVE: no distress. Specifically denies HA, visual change, shortness of breath or chest discomfort.   VITAL SIGNS: Temp:  [97.5 F (36.4 C)-98.3 F (36.8 C)] 98.3 F (36.8 C) (11/01 1529) Pulse Rate:  [46-80] 58 (11/01 1559) Resp:  [6-19] 16 (11/01 1559) BP: (140-257)/(66-129) 228/76 mmHg (11/01 1559) SpO2:  [96 %-100 %] 96 % (11/01 1559) Weight:  [51.313 kg (113 lb 2 oz)-55.5 kg (122 lb 5.7 oz)] 55.5 kg (122 lb 5.7 oz) (11/01 1529) Room air  PHYSICAL EXAMINATION: General:  Resting in bed. No distress Neuro:  Awake, oriented. No focal def HEENT:  NCAT, no JVD Cardiovascular:  Rrr; no MRG Lungs:  Clear, no accessory muscle use  Abdomen:  Soft, non-tender + bowel sounds  Musculoskeletal:  Good strength, equal bulk  Skin:  Warm, brisk Cr trace LE edema    Recent Labs Lab 03/22/15 1110  NA 142  K 3.6  CL 106  CO2 28  BUN 14  CREATININE 1.08*  GLUCOSE 116*    Recent Labs Lab 03/22/15 1110 03/27/15 1535  HGB 9.1* 9.5*  HCT 28.0* 28.2*  WBC 5.4  --   PLT 227  --    No results found.  ASSESSMENT / PLAN: Hypertensive Urgency grade 2 diastolic dysfunction >has known h/o difficult to control HTN. Currently asymptomatic w/ no evidence of end-organ injury.  Plan Hydralazine 10 mg IV q4 hr PRN SBP >170 KVO IVFs 12 lead EKG PCXR to r/o edema Resume home meds. These may need further titration; if creatinine stable will add diuretics back in am  Recent AKI. Suspect that this was more d/t HTN and not due to the hydronephrosis.  Plan F/u chemistry Control BP  Anemia of chronic disease Plan Trend cbc   S/p urological procedure: vaginal hysterectomy/bladder tact surgery Plan Per surgery  Erick Colace ACNP-BC [facility] Pager # [phone] OR # [phone] if no  answer    03/27/2015, 4:07 PM

## 2015-03-27 NOTE — Op Note (Signed)
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 Nicki Reaper Tanayia Wahlquist Assistant: Dr. Eduard Clos Resident: Dr. Harvel Ricks  The patient has the above diagnoses and consented to the above procedure. Her serum creatinine was completely normal preoperatively and she was not symptomatic from her hydronephrosis and I therefor consented her not to have stents.  Preoperative antibiotics were given. Extra care was taken with leg positioning to minimize the risk of compartment syndrome and neuropathy and deep vein thrombosis. Patient had a little bit a loose stool that was cleaned at the beginning of the case. She had complete prolapse of the uterus and bladder. With the prolapse reduced she little to no rectocele. Time out was performed  Dr. cousins performed a transvaginal hysterectomy and removed her ovaries and ran the posterior cuff with running absorbable suture. The ureteral sacral ligaments were very weak and tagged  Her cystocele was very firm with an empty bladder. Dr. cousins and myself have never felt such thickening and rigidity of tissues. I instilled 30 mL of my lidocaine epinephrine mixture. The thick anterior vaginal mucosa was sharply dissected from the underlying pubocervical fascia to the white lines bilaterally and I mobilized appropriately at the apex. I was very pleased with the dissection. It took a long time and I stayed along the shiny thickened white vaginal mucosa  The bladder itself was thickened but much more supple following this. I did a 2 layer imbrication anterior repair with running 2-0 Vicryl not imbricating the bladder neck. I did an anatomic closure and did not shorten her bladder wall.  Before I did my surgery I performed cystoscopy following the hysterectomy. She had bullous  edema in the floor of her bladder from the Foley catheter and prolapse. The ureteral orifices were somewhat difficult to see and were close the bladder neck. She had yellow jets bilaterally but the yellow was always quite faint. This was double and even triple checked.  After cystoscopy she had flattening of her cystocele. She had yellow jets bilaterally with no deviation or retraction of the ureters. Again it was double checked.  I finger dissected along the appropriate plane to the spine bilaterally. I was very pleased with the mobilization of soft tissue medially and I could feel the sacrospinous ligament. With the Capio device I placed a 0 Ethibond in a straight line between the spines 1 full finger breath medial to the spine. This was triple checked. Rectal examination demonstrated excellent position and the sutures were not in the rectum.  With my usual technique I placed a 0 Vicryl on a UR 6 needle into the pelvic sidewall the urethrovesical angle bilaterally  A well-prepared 10 x 6 graft-shaped as a trapezoid was sewn in place. I trimmed an appropriate amount of anterior vaginal wall mucosa and closed the anterior vaginal wall with running 2-0 Vicryl and CT1 needle.  Dr. cousins close the apex as described  She had a lot of redundant mucosa at the apex but with the apex reduced she had minimal to no posterior defect to repair. She had excellent length. The vaginal wall mucosa at the apex was thickened. Two Estrace packs were applied. Leg position was excellent. Blood loss was less than 100 mL. Urine output was very good and colored  Hopefully the surgery will reach the patient's treatment goal  Very importantly at the beginning of the case the  patient had significant urethral mucosal prolapse from edematous tissue between 3 and 9:00. In spite of this she had reasonably good urethral length. Her urethra appeared incompetent intraoperatively recognizing it is difficult to comment on this  functionally under anesthesia. Her bladder neck actually looked open and again this may have been artifact from the previous Foley catheter and Foley balloon and severe prolapse. The open bladder neck was away from the ureteral orifices. In spite of the findings I did not feel it was appropriate to address this recognizing she could have worsening stress incontinence following the surgery.

## 2015-03-27 NOTE — Progress Notes (Signed)
POD #0 S/p TVHBSO w. Vault suspension, cystocele repair Known  CHTN  Now in ICU due to acute HTN postop  S: feels fine Notes some vaginal burning Intraop findings reviewed O:  Filed Vitals:   03/27/15 1600 03/27/15 1615 03/27/15 1700 03/27/15 1800  BP: 221/50 160/57 140/57 148/52  Pulse: 63 86 87 65  Temp: 98.1 F (36.7 C)     TempSrc: Oral     Resp: 17 13 13 17   Height:      Weight:      SpO2: 96% 94% 93% 94%   IMP: HTN improved POD#0  Hypokalemia with plans for repletion P) observe HTN. Cont with postop care as usual. Pt reassured

## 2015-03-27 NOTE — Anesthesia Postprocedure Evaluation (Signed)
  Anesthesia Post-op Note  Patient: Mariah Phillips  Procedure(s) Performed: Procedure(s) (LRB): ANTERIOR (CYSTOCELE)  REPAIR  (N/A) VAGINAL VAULT SUSPENSION AND GRAFT (N/A) CYSTOSCOPY   (Bilateral) HYSTERECTOMY VAGINAL (N/A) SALPINGO OOPHORECTOMY (Bilateral)  Patient Location: PACU  Anesthesia Type: General  Level of Consciousness: awake and alert   Airway and Oxygen Therapy: Patient Spontanous Breathing  Post-op Pain: mild  Post-op Assessment: Post-op Vital signs reviewed, Patient's Cardiovascular Status unstable, Respiratory Function Stable, Patent Airway and No signs of Nausea or vomiting. Ms. Cheek had labile blood pressure readings in the PACU. She was given hydralazine 3 mg IV times two in PACU. She did take her clonidine and carvedilol early this morning. She would normally take her second doses of carvedilol and clonidine at 830 PM. Her last BP in the PACU was 178/68. She was then transferred to the floor. However, her first blood pressure on the floor was 206/77, followed by increasing blood pressure. (of note, her family states that her blood pressure seems to rise with anxiety). Chiquita Loth, RN then called Dr. Matilde Sprang to explain situation (while considering whether to return to PACU for more BP treatment) and Dr. Matilde Sprang decided to transfer to ICU and consult CCM.  Last Vitals:  Filed Vitals:   03/27/15 1529  BP: 243/95  Pulse: 74  Temp:   Resp: 19    Post-op Vital Signs: stable   Complications: No apparent anesthesia complications

## 2015-03-27 NOTE — Progress Notes (Signed)
Received from 5th floor via PACU. RN stated CHG and MRSA swab done prop.

## 2015-03-27 NOTE — Progress Notes (Signed)
Called elink to get in touch with the doctor in the box for critical care three times, once at 1520, 1530 and 1540.  Message relayed that the doctor was very busy with several other patients.  The patient's BP is extremely high systolically and the nurse has no medication to give the patient.  Message relayed that someone was coming to see the patient and deal with the elevated BP shortly.  Continue to monitor BP closely.  Hydralazine 10 mg Iv given for elevated BP per order Marni Griffon NP.  Madylyn Insco Roselie Awkward RN

## 2015-03-27 NOTE — Anesthesia Preprocedure Evaluation (Addendum)
Anesthesia Evaluation  Patient identified by MRN, date of birth, ID band Patient awake  General Assessment Comment:Past Medical History Diagnosis Date . Hypertension  . Uterine prolapse  . Hydronephrosis    bilateral . Esophageal reflux  . Cystocele, midline  . Old myocardial infarction  . Hyperlipidemia  . Anemia    no blood transfusions in past . Edema    lower extremities . Foley catheter in place    hx. urinary retention 03-22-15 Foley cath remains in place.       Reviewed: Allergy & Precautions, NPO status , Patient's Chart, lab work & pertinent test results  Airway Mallampati: II  TM Distance: >3 FB Neck ROM: Full    Dental  (+) Edentulous Lower, Edentulous Upper   Pulmonary former smoker,    Pulmonary exam normal breath sounds clear to auscultation       Cardiovascular Exercise Tolerance: Good hypertension, Pt. on medications and Pt. on home beta blockers + Past MI  Normal cardiovascular exam Rhythm:Regular Rate:Normal  ECHO 02-25-15: Study Conclusions  - Left ventricle: The cavity size was normal. Wall thickness was normal. Systolic function was normal. The estimated ejection fraction was in the range of 55% to 60%. Wall motion was normal; there were no regional wall motion abnormalities. Features are consistent with a pseudonormal left ventricular filling pattern, with concomitant abnormal relaxation and increased filling pressure (grade 2 diastolic dysfunction). - Aortic valve: Valve area (Vmax): 1.31 cm^2. - Mitral valve: There was mild to moderate regurgitation. - Left atrium: The atrium was mildly dilated. - Pulmonary arteries: PA peak pressure: 40 mm Hg (S).    Neuro/Psych negative neurological ROS  negative psych ROS   GI/Hepatic Neg liver ROS, GERD  Medicated,  Endo/Other  negative endocrine ROS  Renal/GU Renal InsufficiencyRenal  disease  negative genitourinary   Musculoskeletal negative musculoskeletal ROS (+)   Abdominal   Peds negative pediatric ROS (+)  Hematology  (+) anemia , hgb 9.1   Anesthesia Other Findings   Reproductive/Obstetrics negative OB ROS                        Anesthesia Physical Anesthesia Plan  ASA: III  Anesthesia Plan: General   Post-op Pain Management:    Induction: Intravenous  Airway Management Planned: Oral ETT  Additional Equipment:   Intra-op Plan:   Post-operative Plan: Extubation in OR  Informed Consent: I have reviewed the patients History and Physical, chart, labs and discussed the procedure including the risks, benefits and alternatives for the proposed anesthesia with the patient or authorized representative who has indicated his/her understanding and acceptance.   Dental advisory given  Plan Discussed with: CRNA  Anesthesia Plan Comments:         Anesthesia Quick Evaluation

## 2015-03-27 NOTE — Transfer of Care (Signed)
Immediate Anesthesia Transfer of Care Note  Patient: Haylei Cobin  Procedure(s) Performed: Procedure(s): ANTERIOR (CYSTOCELE) AND POSTERIOR REPAIR (RECTOCELE) (N/A) VAGINAL VAULT SUSPENSION AND GRAFT (N/A) CYSTOSCOPY WITH POSSIBLE BILATERAL STENTS AND  RETROGRADE URETHROGRAM (Bilateral) HYSTERECTOMY VAGINAL (N/A) SALPINGO OOPHORECTOMY (Bilateral)  Patient Location: PACU  Anesthesia Type:General  Level of Consciousness: awake, alert  and oriented  Airway & Oxygen Therapy: Patient Spontanous Breathing and Patient connected to face mask oxygen  Post-op Assessment: Report given to RN and Post -op Vital signs reviewed and stable  Post vital signs: Reviewed and stable  Last Vitals:  Filed Vitals:   03/27/15 0551  BP: 167/66  Pulse: 63  Temp: 36.6 C  Resp: 16    Complications: No apparent anesthesia complications

## 2015-03-27 NOTE — Brief Op Note (Signed)
03/27/2015  11:52 AM  PATIENT:  Mariah Phillips  74 y.o. female  PRE-OPERATIVE DIAGNOSIS:  CYSTOCELE,RECTOCELE VAULT PROLAPSE,COMPLETE UTERINE PROLAPSE,STRESS URINARY INCONTINENCE  POST-OPERATIVE DIAGNOSIS:  CYSTOCELE, VAULT PROLAPSE, PROCIDENTIA, SUI  PROCEDURE:  TOTAL VAGINAL HYSTERECTOMY, BSO, MCCALL CULDOPLASTY  SURGEON:  Surgeon(s) and Role: Panel 1:    * Bjorn Loser, MD - Primary  Panel 2:    * Servando Salina, MD - Primary  PHYSICIAN ASSISTANT:   ASSISTANTS: Bjorn Loser, MD   Findings: complete uterine prolapse, edematous urethral mucosa, incontinence, nl tubes, ovaries, large cystocele  ANESTHESIA:   general  EBL:  Total I/O In: 2000 [I.V.:2000] Out: 100 [Blood:100]  BLOOD ADMINISTERED:none  DRAINS: none   LOCAL MEDICATIONS USED:  LIDOCAINE with epinephrine  SPECIMEN:  Source of Specimen:  uterus with cervix, left tube and ovary, right tube and ovary  DISPOSITION OF SPECIMEN:  PATHOLOGY  COUNTS:  YES  TOURNIQUET:  * No tourniquets in log *  DICTATION: .Other Dictation: Dictation Number [id number]  PLAN OF CARE: Admit for overnight observation  PATIENT DISPOSITION:  PACU - hemodynamically stable.   Delay start of Pharmacological VTE agent (>24hrs) due to surgical blood loss or risk of bleeding: no

## 2015-03-28 DIAGNOSIS — I160 Hypertensive urgency: Secondary | ICD-10-CM | POA: Diagnosis not present

## 2015-03-28 DIAGNOSIS — I5032 Chronic diastolic (congestive) heart failure: Secondary | ICD-10-CM | POA: Diagnosis not present

## 2015-03-28 DIAGNOSIS — N8110 Cystocele, unspecified: Secondary | ICD-10-CM | POA: Diagnosis not present

## 2015-03-28 DIAGNOSIS — N813 Complete uterovaginal prolapse: Secondary | ICD-10-CM | POA: Diagnosis not present

## 2015-03-28 DIAGNOSIS — I10 Essential (primary) hypertension: Secondary | ICD-10-CM | POA: Diagnosis not present

## 2015-03-28 LAB — BASIC METABOLIC PANEL
ANION GAP: 7 (ref 5–15)
BUN: 13 mg/dL (ref 6–20)
CALCIUM: 7.6 mg/dL — AB (ref 8.9–10.3)
CO2: 28 mmol/L (ref 22–32)
Chloride: 104 mmol/L (ref 101–111)
Creatinine, Ser: 1.08 mg/dL — ABNORMAL HIGH (ref 0.44–1.00)
GFR calc Af Amer: 58 mL/min — ABNORMAL LOW (ref >60)
GFR calc non Af Amer: 50 mL/min — ABNORMAL LOW (ref >60)
GLUCOSE: 135 mg/dL — AB (ref 65–99)
Potassium: 3.6 mmol/L (ref 3.5–5.1)
SODIUM: 139 mmol/L (ref 135–145)

## 2015-03-28 LAB — MAGNESIUM: MAGNESIUM: 1.1 mg/dL — AB (ref 1.7–2.4)

## 2015-03-28 LAB — HEMOGLOBIN AND HEMATOCRIT, BLOOD
HCT: 25.1 % — ABNORMAL LOW (ref 36.0–46.0)
HEMOGLOBIN: 8.5 g/dL — AB (ref 12.0–15.0)

## 2015-03-28 LAB — TROPONIN I: Troponin I: <0.03 ng/mL (ref <0.031)

## 2015-03-28 LAB — PHOSPHORUS: Phosphorus: 3.8 mg/dL (ref 2.5–4.6)

## 2015-03-28 MED ORDER — FUROSEMIDE 10 MG/ML IJ SOLN
40.0000 mg | Freq: Once | INTRAMUSCULAR | Status: AC
Start: 2015-03-28 — End: 2015-03-28
  Administered 2015-03-28: 40 mg via INTRAVENOUS
  Filled 2015-03-28: qty 4

## 2015-03-28 MED ORDER — HYDROCHLOROTHIAZIDE 25 MG PO TABS
25.0000 mg | ORAL_TABLET | Freq: Every day | ORAL | Status: DC
  Administered 2015-03-28: 25 mg via ORAL
  Filled 2015-03-28: qty 1

## 2015-03-28 MED ORDER — POLYETHYLENE GLYCOL 3350 17 G PO PACK
17.0000 g | PACK | Freq: Every day | ORAL | Status: DC
  Administered 2015-03-28 – 2015-03-29 (×2): 17 g via ORAL
  Filled 2015-03-28 (×2): qty 1

## 2015-03-28 MED ORDER — MAGNESIUM SULFATE 2 GM/50ML IV SOLN
2.0000 g | Freq: Once | INTRAVENOUS | Status: AC
  Administered 2015-03-28: 2 g via INTRAVENOUS
  Filled 2015-03-28: qty 50

## 2015-03-28 MED ORDER — CHLORTHALIDONE 25 MG PO TABS
25.0000 mg | ORAL_TABLET | Freq: Every day | ORAL | Status: DC
  Administered 2015-03-29: 25 mg via ORAL
  Filled 2015-03-28 (×3): qty 1

## 2015-03-28 MED ORDER — HYDRALAZINE HCL 50 MG PO TABS
50.0000 mg | ORAL_TABLET | Freq: Three times a day (TID) | ORAL | Status: DC
  Administered 2015-03-28 – 2015-03-29 (×4): 50 mg via ORAL
  Filled 2015-03-28 (×7): qty 1

## 2015-03-28 NOTE — Progress Notes (Signed)
Subjective: Patient reports tolerating PO. Just had vaginal packing andf foley taken out. Denies any pain. Vaginal  Burning resolved.  Started on hydralazine for better BP mgmt  Objective: I have reviewed patient's vital signs.  vital signs and labs. Filed Vitals:   03/28/15 0800  BP: 204/58  Pulse: 62  Temp: 98.9 F (37.2 C)  Resp: 16   I/O last 3 completed shifts: In: 4190 [P.O.:780; I.V.:3210; IV Piggyback:200] Out: 2950 [Urine:2850; Blood:100] Total I/O In: 10 [I.V.:10] Out: 900 [Urine:900]  Lab Results  Component Value Date   WBC 5.4 03/22/2015   HGB 8.5* 03/28/2015   HCT 25.1* 03/28/2015   MCV 86.2 03/22/2015   PLT 227 03/22/2015   Lab Results  Component Value Date   CREATININE 1.08* 03/28/2015    EXAM General: alert, cooperative and no distress Resp: clear to auscultation bilaterally Cardio: regular rate and rhythm, S1, S2 normal, no murmur, click, rub or gallop GI: soft, non-tender; bowel sounds normal; no masses,  no organomegaly Extremities: no edema, redness or tenderness in the calves or thighs Vaginal Bleeding: scant streak of blood  Assessment: s/p Procedure(s): ANTERIOR (CYSTOCELE)  REPAIR  VAGINAL VAULT SUSPENSION AND GRAFT CYSTOSCOPY   HYSTERECTOMY VAGINAL SALPINGO OOPHORECTOMY: stable and tolerating diet Chronic HTN poorly controlled With hydralazine added to regimen Pulmonary edema S/P lasix Postoperative anemia  Plan: pt remaining inpt to continue to monitor BP esp given new med added Reviewed with pt and daughter d/c instructions anticipating poss discharge in am if BP better Stressed cannot strain with BM. To do colace, miralax F/u with me 6 weeks Aware need to follow instructions given for urological standpt w/r to lifting etc Advance diet Encourage ambulation       Mostafa Yuan A, MD 03/28/2015 9:14 AM    03/28/2015, 9:14 AM

## 2015-03-28 NOTE — Op Note (Signed)
NAMERAAHI, KORBER                ACCOUNT NO.:  [id number]  MEDICAL RECORD NO.:  [id number]  LOCATION:  2353                         FACILITY:  [facility]  PHYSICIAN:  Servando Salina, M.D.DATE OF BIRTH:  07-14-1940  DATE OF PROCEDURE:  03/27/2015 DATE OF DISCHARGE:                              OPERATIVE REPORT   PREOPERATIVE DIAGNOSIS:  Procidentia, cystocele, vault prolapse.  PROCEDURE:  Total vaginal hysterectomy, bilateral salpingo-oophorectomy.  POSTOPERATIVE DIAGNOSIS:  Procidentia, cystocele, vault prolapse.  ANESTHESIA:  General.  SURGEON:  Servando Salina, M.D.  ASSISTANTS: 1. Kirstie Peri, M.D. 2. Dr. Harvel Ricks.  PROCEDURE:  Under adequate general anesthesia, the patient had been positioned in the dorsal lithotomy position.  Examination was notable for complete prolapse of her uterus visible.  The patient was shaven, sterilely prepped, draped and procidentia reduced.  Examination was also notable for markedly edematous posterior everted urethral mucosa and indwelling Foley catheter was in place and that had been removed prior to prepping the patient.  Examination under anesthesia did not reveal any adnexal masses.  The vagina was atrophic.  Using a weighted speculum and retracted anteriorly.  The anterior and posterior lip of the cervix was grasped with Yates Decamp clamps.  The cervicovaginal junction  was injected with solution of 1% lidocaine with epinephrine.  A circumferential incision was then made at the cervicovaginal junction. The posterior fornix cul-de-sac was opened and extended transversely. The vaginal cuff posteriorly was then oversewn with 0 Vicryl running lock stitch.  The weight speculum was then re-positioned into the posterior cavity.  The uterosacral ligaments which were bilaterally clamped, cut, and suture ligated with 0 Vicryl Heaney sutures.  Attention was then turned anteriorly. With sharp dissection, ultimately careful  dissection resulted in the anterior cul-de-sac being opened as well.  Once that was done, the LigaSure was used bilaterally to serially  clamp, cauterized, and cut the cardinal ligaments, uterine vessels until the utero-ovarian ligaments were reached.  At that point, partially inverting the uterus showed a prominent ovaries bilaterally.  On the patient's right, the utero-ovarian junction was doubly clamped and the uterus was then cut from that attachment.  Attention just turned back to the left side. On the left side continued using the LigaSure clamped cauterized and cut until the left tube and ovary was able to be removed with the specimen. Attention was then turned back to the right tube and ovary which was then identified.  IP ligament was serially clamped, cauterized, and then cut with ultimate removal of the right tube and ovary as well.  Specimen at that point was the uterus and cervix and left tube and ovary along with the right tube and ovary.  At the end of that portion of the case, Dr. Matilde Sprang began his procedure.  Cystoscopy was performed by him  with bilateral jets be noted prior to his starting the cystocele repair and the vaginal vault suspension. Please see his dictated notes for the details of those procedures.  I performed a McCall culdoplasty  with 0 Vicryl suture.  The vaginal cuff was then closed with 0 Vicryl interrupted figure-of-eight sutures.  The vagina was rrigated, suctioned, and lubricated with Estrace and subsequently packed with an  Estrace soaked gauze.  Specimen was uterus, cervix, tubes, and ovaries, sent to Pathology.  ESTIMATED BLOOD LOSS:  80 mL.  INTRAOPERATIVE FLUID:  2 L.  COMPLICATION:  None.  The patient tolerated the procedure well was transferred to recovery in stable condition.     Servando Salina, M.D.     [place]/MEDQ  D:  03/27/2015  T:  03/28/2015  Job:  [id number]

## 2015-03-28 NOTE — Progress Notes (Signed)
Report called to [place], [place] on 5W. Patient will be transferred. Bladder scan is to be completed for first 2 voids and MD needs to be contacted with residual results. Patient is to void by 1pm.

## 2015-03-28 NOTE — Progress Notes (Signed)
Name: Mariah Phillips MRN: [id number] DOB: 1941/05/25    ADMISSION DATE:  03/27/2015 CONSULTATION DATE:  11/1  REFERRING MD :  MacDiarmid   CHIEF COMPLAINT:  HTN management   BRIEF PATIENT DESCRIPTION:  This is a 74 year old female who had recently been discharged from the hospital on 10/6 after going to her cardiologist for pre-op evaluation for planned hysterectomy and bladder tack. During this visit she was found to have increased exertional dyspnea, LE swelling, fatigue and HTN so she was admitted for in-patient titration her anti-hypertensive medications. During this evaluation she was found to have: diastolic dysfunction, AKI, and associated dyspnea due to pulmonary edema. She was diuresed and her BP regimen was adjusted, then eventually discharged to home on Clonidine and carvedilol. She reported for her elective urologic procedure on 11/1. Intra op was > 2 liters +. Did has some mild intra-op HTN w/ SBP ~245mmHg which was treated w/ 3mg  hydralazine IV. Her BP improved to 150s, she recovered to PACU then to the medical ward. Shortly after arrival to the med/surg unit her BP was in 220s. She was transferred to the ICU for PCCM to further assist w/ her HTN    SIGNIFICANT EVENTS  11/1:Vault prolapse repair and cystocele repair and graft and cystoscopy/ hysterectomy/  preop she did take her clonidine and carvedilol this am   STUDIES:  Echo 10/2: EF 55-60%; left ventricular filling pattern,with concomitant abnormal relaxation and increased filling pressure (grade 2 diastolic dysfunction).  VITAL SIGNS: Temp:  [97.5 F (36.4 C)-98.9 F (37.2 C)] 98.9 F (37.2 C) (11/02 0800) Pulse Rate:  [46-87] 62 (11/02 0800) Resp:  [6-19] 16 (11/02 0800) BP: (140-257)/(40-129) 204/58 mmHg (11/02 0800) SpO2:  [92 %-100 %] 97 % (11/02 0800) Weight:  [55.5 kg (122 lb 5.7 oz)] 55.5 kg (122 lb 5.7 oz) (11/01 1529)  Room air   Intake/Output Summary (Last 24 hours) at 03/28/15 0834 Last data filed at  03/28/15 0800  Gross per 24 hour  Intake   4200 ml  Output   3600 ml  Net    600 ml   PHYSICAL EXAMINATION: General:  Resting in bed. No distress Neuro:  Awake, oriented. No focal def HEENT:  NCAT, no JVD Cardiovascular:  Rrr; no MRG Lungs:  Clear Abdomen:  Soft, non-tender + bowel sounds  Musculoskeletal:  Good strength, equal bulk  Skin:  Warm, brisk Cr trace LE edema    Recent Labs Lab 03/22/15 1110 03/27/15 1535 03/28/15 0030  NA 142 138 139  K 3.6 3.0* 3.6  CL 106 104 104  CO2 28 25 28   BUN 14 15 13   CREATININE 1.08* 1.08* 1.08*  GLUCOSE 116* 152* 135*  .  Recent Labs Lab 03/22/15 1110 03/27/15 1535 03/28/15 0030  HGB 9.1* 9.5* 8.5*  HCT 28.0* 28.2* 25.1*  WBC 5.4  --   --   PLT 227  --   --    Dg Chest Port 1 View  03/27/2015  CLINICAL DATA:  Hypertension after surgery today. EXAM: PORTABLE CHEST 1 VIEW COMPARISON:  None. FINDINGS: The heart size is enlarged. The mediastinal contour is normal. There is increased pulmonary interstitium bilaterally. There is no pleural effusion. No acute abnormalities identified within the visualized bones. IMPRESSION: Mild interstitial pulmonary edema.  Cardiomegaly. Electronically Signed   By: Abelardo Diesel M.D.   On: 03/27/2015 17:12  pcxr did have some mild interstitial edema   ASSESSMENT / PLAN: Hypertensive Urgency grade 2 diastolic dysfunction >has known h/o difficult  to control HTN. Currently asymptomatic w/ no evidence of end-organ injury.  Plan Cont clonidine BID (hesitant make > BID given age) Cont Carvedilol  Add Chlortalidone 25mg  daily Will add scheduled oral hydralazine Would cont BP monitoring another 24hrs or so; if maintains <170 probably safe to go home w/ out-pt f/u w PCP  Recent AKI. Suspect that this was more d/t HTN and not due to the hydronephrosis-->stable Plan Control BP Add back HCTZ; f/u chemistry   Hypomagnesemia Plan Replaced/ recheck in am   Anemia of chronic disease Plan Trend cbc     S/p urological procedure: vaginal hysterectomy/bladder tact surgery Plan Per surgery  We will s/o. Agree she can be out of the ICU. If further BP management needed would ask Internal medicine to assist. No need for further Critical Care involvement.    Erick Colace ACNP-BC [place] Pager # [phone] OR # [phone] if no answer    03/28/2015, 8:30 AM  Attending:  I have seen and examined the patient with nurse practitioner/resident and agree with the note above.   [name] feels well this morning. BP overnight improved somewhat, but one high reading again this morning. Recent hospitalization records reviewed where she was seen by cardiology for "mild CHF exacerbation" and hypertension.  Coreg and clonidine used during that visit.  On exam:  Lungs clear, normal respiratory effort CV exam: RRR, no JVD, skin well perfused  Labs reviewed  Hypertension: Continue coreg, clonidine at home doses Add chlorthalidone Continue monitoring on med-surg floor  If BP continues to be a problem, would consult internal medicine.  Roselie Awkward, MD [place] [facility] Pager: [phone] Cell: [phone] After 3pm or if no response, call [phone]

## 2015-03-28 NOTE — Progress Notes (Signed)
Yesterday: no pain; alert; labs [facility]; sent to ICU for BP monitoring and treatment  Today: feels very good; no pain; no hydralizine thru night and on po meds Cr and Hb normal Dc foley and vaginal pack Discussed urethral findings with pt and risk of incontinence

## 2015-03-28 NOTE — Progress Notes (Signed)
TR[facility] consult PROGRESS NOTE  Mariah Phillips KCL:[id number] DOB: 1941-05-21 DOA: 03/27/2015 PCP: PROVIDER NOT IN SYSTEM  HPI/Recap of past 24 hours:  bp still elevated but slightly better, denies chest pain, no sob, no headache. Foley removed this am, no urinary difficulty. Son in room.  Assessment/Plan: Active Problems:   Cystocele   Asymptomatic hypertensive urgency  Malignant hypertension /hypertensive urgency: sbp above 200, currently on coreg/chlorthalidone/hydralazine/clonidine.  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   Chronic Diastolic CHF: continue diuretics. Close monitor volume status, lasix prn in the hospital  H/o cad s/p old MI in 1984 per cardiology note, denies chest pain, continue coreg/statin, patient is not on asa? Continue outpatient cardiology follow up.  S/p Total vaginal hysterectomy, bilateral salpingo-oophorectomy. On 11/1 management per Dr. Garwin Brothers  S/p Vault prolapse repair and cystocele repair and graft and cystoscopy on  11/1, management per  Dr. Matilde Sprang    Code Status: full  Family Communication: patient and son in room  Disposition Plan: home   Consultants:  Urology primary  [facility] consult  pccm consult  Procedures: Total vaginal hysterectomy, bilateral salpingo-oophorectomy. On 11/1 Dr. Garwin Brothers Vault prolapse repair and cystocele repair and graft and cystoscopy 11/1 Dr. Matilde Sprang  Antibiotics:  Perioperative cipro and ancef   Objective: BP 172/54 mmHg  Pulse 80  Temp(Src) 98.7 F (37.1 C) (Oral)  Resp 16  Ht 4\' 7"  (1.397 m)  Wt 122 lb 5.7 oz (55.5 kg)  BMI 28.44 kg/m2  SpO2 97%  Intake/Output Summary (Last 24 hours) at 03/28/15 1907 Last data filed at 03/28/15 1853  Gross per 24 hour  Intake    390 ml  Output    4400 ml  Net  -4010 ml   Filed Weights   03/27/15 0551 03/27/15 1529  Weight: 113 lb 2 oz (51.313 kg) 122 lb 5.7 oz (55.5 kg)    Exam:   General:  NAD  Cardiovascular: RRR  Respiratory: CTABL  Abdomen: Soft/ND/NT, positive BS  Musculoskeletal: trace pedal  Edema  Neuro: aaox3  Data Reviewed: Basic Metabolic Panel:  Recent Labs Lab 03/22/15 1110 03/27/15 1535 03/28/15 0030  NA 142 138 139  K 3.6 3.0* 3.6  CL 106 104 104  CO2 28 25 28   GLUCOSE 116* 152* 135*  BUN 14 15 13   CREATININE 1.08* 1.08* 1.08*  CALCIUM 7.9* 7.8* 7.6*  MG  --   --  1.1*  PHOS  --   --  3.8   Liver Function Tests: No results for input(s): AST, ALT, ALKPHOS, BILITOT, PROT, ALBUMIN in the last 168 hours. No results for input(s): LIPASE, AMYLASE in the last 168 hours. No results for input(s): AMMONIA in the last 168 hours. CBC:  Recent Labs Lab 03/22/15 1110 03/27/15 1535 03/28/15 0030  WBC 5.4  --   --   HGB 9.1* 9.5* 8.5*  HCT 28.0* 28.2* 25.1*  MCV 86.2  --   --   PLT 227  --   --    Cardiac Enzymes:    Recent Labs Lab 03/27/15 1804 03/28/15 0030 03/28/15 0931  TROPONINI <0.03 <0.03 <0.03   BNP (last 3 results)  Recent Labs  02/24/15 1050  BNP 400.4*    ProBNP (last 3 results) No results for input(s): PROBNP in the last 8760 hours.  CBG:  Recent Labs Lab 03/27/15 2005  GLUCAP 223*    No results  found for this or any previous visit (from the past 240 hour(s)).   Studies: No results found.  Scheduled Meds: . carvedilol  12.5 mg Oral BID WC  . chlorthalidone  25 mg Oral Daily  . cloNIDine  0.2 mg Oral BID  . hydrALAZINE  50 mg Oral 3 times per day  . pantoprazole  40 mg Oral Daily  . polyethylene glycol  17 g Oral Daily  . pravastatin  20 mg Oral QPM    Continuous Infusions: . dextrose 5 % and 0.45 % NaCl with KCl 20 mEq/L 10 mL/hr at 03/27/15 1630     Time spent: 77mins  Zyire Eidson MD, PhD  [facility] Pager [phone]. If 7PM-7AM,  please contact night-coverage at [email or web address], password [facility] 03/28/2015, 7:07 PM

## 2015-03-28 NOTE — Progress Notes (Signed)
Vaginal packing and Foley removed per order. No complications. Will continue to monitor patient.

## 2015-03-28 NOTE — Progress Notes (Signed)
[place] Progress Note Patient Name: Eryanna Regal DOB: [birth year] MRN: [id number]   Date of Service  03/28/2015  HPI/Events of Note  Mild pulm edema on PCXR up  3 liter positive.  eICU Interventions  Plan: 40 mg lasix IVP times one     Intervention Category Intermediate Interventions: Other:  DETERDING,ELIZABETH 03/28/2015, 1:52 AM

## 2015-03-29 DIAGNOSIS — I10 Essential (primary) hypertension: Secondary | ICD-10-CM

## 2015-03-29 DIAGNOSIS — N813 Complete uterovaginal prolapse: Secondary | ICD-10-CM | POA: Diagnosis not present

## 2015-03-29 LAB — COMPREHENSIVE METABOLIC PANEL
ALT: 31 U/L (ref 14–54)
ANION GAP: 7 (ref 5–15)
AST: 23 U/L (ref 15–41)
Albumin: 2.8 g/dL — ABNORMAL LOW (ref 3.5–5.0)
Alkaline Phosphatase: 97 U/L (ref 38–126)
BILIRUBIN TOTAL: 0.4 mg/dL (ref 0.3–1.2)
BUN: 19 mg/dL (ref 6–20)
CHLORIDE: 102 mmol/L (ref 101–111)
CO2: 31 mmol/L (ref 22–32)
Calcium: 7.9 mg/dL — ABNORMAL LOW (ref 8.9–10.3)
Creatinine, Ser: 1.2 mg/dL — ABNORMAL HIGH (ref 0.44–1.00)
GFR, EST AFRICAN AMERICAN: 51 mL/min — AB (ref >60)
GFR, EST NON AFRICAN AMERICAN: 44 mL/min — AB (ref >60)
Glucose, Bld: 115 mg/dL — ABNORMAL HIGH (ref 65–99)
POTASSIUM: 2.8 mmol/L — AB (ref 3.5–5.1)
Sodium: 140 mmol/L (ref 135–145)
TOTAL PROTEIN: 5.7 g/dL — AB (ref 6.5–8.1)

## 2015-03-29 LAB — CBC
HEMATOCRIT: 24.7 % — AB (ref 36.0–46.0)
Hemoglobin: 7.8 g/dL — ABNORMAL LOW (ref 12.0–15.0)
MCH: 27.1 pg (ref 26.0–34.0)
MCHC: 31.6 g/dL (ref 30.0–36.0)
MCV: 85.8 fL (ref 78.0–100.0)
PLATELETS: 235 10*3/uL (ref 150–400)
RBC: 2.88 MIL/uL — ABNORMAL LOW (ref 3.87–5.11)
RDW: 14.2 % (ref 11.5–15.5)
WBC: 9.1 10*3/uL (ref 4.0–10.5)

## 2015-03-29 LAB — LIPID PANEL
CHOLESTEROL: 104 mg/dL (ref 0–200)
HDL: 44 mg/dL (ref >40)
LDL Cholesterol: 42 mg/dL (ref 0–99)
TRIGLYCERIDES: 91 mg/dL (ref <150)
Total CHOL/HDL Ratio: 2.4 RATIO
VLDL: 18 mg/dL (ref 0–40)

## 2015-03-29 LAB — MAGNESIUM: MAGNESIUM: 2 mg/dL (ref 1.7–2.4)

## 2015-03-29 LAB — TSH: TSH: 0.592 u[IU]/mL (ref 0.350–4.500)

## 2015-03-29 LAB — PHOSPHORUS: PHOSPHORUS: 2.6 mg/dL (ref 2.5–4.6)

## 2015-03-29 MED ORDER — POTASSIUM CHLORIDE CRYS ER 20 MEQ PO TBCR
40.0000 meq | EXTENDED_RELEASE_TABLET | ORAL | Status: DC
  Administered 2015-03-29: 40 meq via ORAL
  Filled 2015-03-29: qty 2

## 2015-03-29 MED ORDER — HYDRALAZINE HCL 50 MG PO TABS: 50.0000 mg | ORAL_TABLET | Freq: Three times a day (TID) | ORAL | Status: DC

## 2015-03-29 MED ORDER — HYDROCODONE-ACETAMINOPHEN 5-325 MG PO TABS: 1.0000 | ORAL_TABLET | Freq: Four times a day (QID) | ORAL | Status: DC | PRN

## 2015-03-29 NOTE — Progress Notes (Signed)
2 Days Post-Op Subjective: Patient reports no pain, nausea, vomiting. She is tolerating a regular diet and is passing flatus and had a BM. She has not ambulated yet but is confident she will be able to. Voiding spontaneously after foley removal yesterday with low PVR yesterday.   Objective: Vital signs in last 24 hours: Temp:  [97.8 F (36.6 C)-100.8 F (38.2 C)] 97.8 F (36.6 C) (11/03 0441) Pulse Rate:  [58-80] 67 (11/03 0755) Resp:  [14-18] 16 (11/03 0441) BP: (114-179)/(34-110) 156/61 mmHg (11/03 0755) SpO2:  [91 %-99 %] 97 % (11/03 0441)  Intake/Output from previous day: 11/02 0701 - 11/03 0700 In: 150 [P.O.:60; I.V.:40; IV Piggyback:50] Out: 4150 [Urine:4150] Intake/Output this shift: Total I/O In: -  Out: 650 [Urine:650]  Physical Exam:  General:alert, cooperative and appears stated age GI: soft, non tender, normal bowel sounds, no palpable masses, no organomegaly, no flank pain bilaterally Extremities: extremities normal, atraumatic, no cyanosis or edema  Lab Results:  Recent Labs  03/27/15 1535 03/28/15 0030 03/29/15 0435  HGB 9.5* 8.5* 7.8*  HCT 28.2* 25.1* 24.7*   BMET  Recent Labs  03/28/15 0030 03/29/15 0435  NA 139 140  K 3.6 2.8*  CL 104 102  CO2 28 31  GLUCOSE 135* 115*  BUN 13 19  CREATININE 1.08* 1.20*  CALCIUM 7.6* 7.9*   No results for input(s): LABPT, INR in the last 72 hours. No results for input(s): LABURIN in the last 72 hours. Results for orders placed or performed during the hospital encounter of 02/22/15  Culture, blood (routine x 2)     Status: None   Collection Time: 02/24/15  7:50 PM  Result Value Ref Range Status   Specimen Description BLOOD RIGHT HAND  Final   Special Requests BOTTLES DRAWN AEROBIC ONLY 5CC  Final   Culture NO GROWTH 5 DAYS  Final   Report Status 03/01/2015 FINAL  Final  Culture, blood (routine x 2)     Status: None   Collection Time: 02/24/15  7:59 PM  Result Value Ref Range Status   Specimen  Description BLOOD LEFT ANTECUBITAL  Final   Special Requests IN PEDIATRIC BOTTLE [facility]  Final   Culture NO GROWTH 5 DAYS  Final   Report Status 03/01/2015 FINAL  Final    Studies/Results: Dg Chest Port 1 View  03/27/2015  CLINICAL DATA:  Hypertension after surgery today. EXAM: PORTABLE CHEST 1 VIEW COMPARISON:  None. FINDINGS: The heart size is enlarged. The mediastinal contour is normal. There is increased pulmonary interstitium bilaterally. There is no pleural effusion. No acute abnormalities identified within the visualized bones. IMPRESSION: Mild interstitial pulmonary edema.  Cardiomegaly. Electronically Signed   By: Abelardo Diesel M.D.   On: 03/27/2015 17:12    Assessment/Plan: 74 yo F POD#2 vaginal TAH BSO, cystocele and vaginal vault repair who is recovering well. Issues with hypertension post-op which is overall improved. Hgb 7.8 today but overall stable. Potassium low at 2.8 today. Cr 1.2 from 1.08.  - Replete K orally  - Discharge to home today - Recommend follow up with PCP ASAP for continued optimization of BP control and repeat CBC/BMP to re-evaluate hgb, potassium and creatinine      Acie Fredrickson 03/29/2015, 8:35 AM

## 2015-03-29 NOTE — Progress Notes (Signed)
Patient is discharged by urology, bp much better controlled, labs with hypokalemia, replace k orally, need outpatient pcp follow up appointment for bp control and repeat basic labs.

## 2015-03-29 NOTE — Progress Notes (Signed)
Feels great No pain Urine output good Voiding well large volumes; incontinent once; PVR acceptable yesterday No flank or leg pain  BM pos Mild rise in Cr today likely from decrease in BP- in my opinion not post renal at this stage (was 1.08 for two days post op); 4 liter output Hb 7.8 Instructions given for discharge i will also given BP pill Rx

## 2015-03-29 NOTE — Discharge Summary (Signed)
Date of admission: 03/27/2015  Date of discharge: 03/29/2015  Admission diagnosis: cystocele  Discharge diagnosis: cystocele  Secondary diagnoses: uterine and vault prolapse  History and Physical: For full details, please see admission history and physical. Briefly, Mariah Phillips is a 74 y.o. year old patient with the above diagnosis.   Hospital Course: Surgery went very well. Post op course prolonged due to elevated BP managed with iv and po hydralazine as part of her polypharmacy   Laboratory values:  Recent Labs  03/27/15 1535 03/28/15 0030 03/29/15 0435  HGB 9.5* 8.5* 7.8*  HCT 28.2* 25.1* 24.7*    Recent Labs  03/28/15 0030 03/29/15 0435  CREATININE 1.08* 1.20*    Disposition: Home  Discharge instruction: The patient was instructed to be ambulatory but told to refrain from heavy lifting, strenuous activity, or driving. Detailed  Discharge medications:    Medication List    TAKE these medications        carvedilol 12.5 MG tablet  Commonly known as:  COREG  Take 1 tablet (12.5 mg total) by mouth 2 (two) times daily.     cloNIDine 0.2 MG tablet  Commonly known as:  CATAPRES  Take 1 tablet (0.2 mg total) by mouth 2 (two) times daily.     hydrALAZINE 50 MG tablet  Commonly known as:  APRESOLINE  Take 1 tablet (50 mg total) by mouth every 8 (eight) hours.     HYDROcodone-acetaminophen 5-325 MG tablet  Commonly known as:  NORCO  Take 1-2 tablets by mouth every 6 (six) hours as needed for moderate pain.     omeprazole 20 MG capsule  Commonly known as:  PRILOSEC  Take 1 capsule by mouth every morning.     pravastatin 20 MG tablet  Commonly known as:  PRAVACHOL  Take 20 mg by mouth every evening.        Followup:      Follow-up Information    Follow up with Kealy Lewter A, MD.   Specialty:  Urology   Why:  as scheduled   Contact information:   [place] [address] [phone]

## 2015-03-29 NOTE — Discharge Instructions (Signed)
I have reviewed discharge instructions in detail with the patient. They will follow-up with me or their physician as scheduled. My nurse will also be calling the patients as per protocol.   

## 2015-03-30 LAB — HEMOGLOBIN A1C
HEMOGLOBIN A1C: 5.3 % (ref 4.8–5.6)
Mean Plasma Glucose: 105 mg/dL

## 2015-03-31 LAB — CATECHOLAMINES, FRACTIONATED, PLASMA
DOPAMINE: 98 pg/mL — AB (ref 0–48)
Epinephrine: 46 pg/mL (ref 0–62)
Norepinephrine: 761 pg/mL (ref 0–874)

## 2015-04-01 LAB — METANEPHRINES, PLASMA
Metanephrine, Free: 22 pg/mL (ref 0–62)
Normetanephrine, Free: 71 pg/mL (ref 0–145)

## 2015-04-02 LAB — ALDOSTERONE + RENIN ACTIVITY W/ RATIO: PRA LC/MS/MS: 0.74 ng/mL/hr

## 2015-04-26 ENCOUNTER — Encounter: Payer: Self-pay | Admitting: Interventional Cardiology

## 2015-04-26 ENCOUNTER — Ambulatory Visit: Payer: BLUE CROSS/BLUE SHIELD | Admitting: Interventional Cardiology

## 2015-04-26 ENCOUNTER — Ambulatory Visit (INDEPENDENT_AMBULATORY_CARE_PROVIDER_SITE_OTHER): Payer: BLUE CROSS/BLUE SHIELD | Admitting: Interventional Cardiology

## 2015-04-26 VITALS — BP 182/50 | HR 66 | Ht <= 58 in | Wt 110.0 lb

## 2015-04-26 DIAGNOSIS — I10 Essential (primary) hypertension: Secondary | ICD-10-CM | POA: Diagnosis not present

## 2015-04-26 MED ORDER — AMLODIPINE BESYLATE 5 MG PO TABS: 5.0000 mg | ORAL_TABLET | Freq: Every day | ORAL | Status: DC

## 2015-04-26 NOTE — Patient Instructions (Signed)
**Note De-Identified Demara Lover Obfuscation** Medication Instructions:  Start taking Amlodipine 5 mg daily  Labwork: None  Testing/Procedures: None  Follow-Up: Your physician recommends that you schedule a follow-up appointment in: 2 weeks for a BP check with our Pharmacist.      If you need a refill on your cardiac medications before your next appointment, please call your pharmacy.

## 2015-04-26 NOTE — Progress Notes (Signed)
Patient ID: Mariah Phillips, female   DOB: January 04, 1941, 75 y.o.   MRN: [id number]     Cardiology Office Note   Date:  04/26/2015   ID:  Mariah Phillips, DOB Feb 20, 1941, MRN [id number]  PCP:  PROVIDER NOT IN SYSTEM    No chief complaint on file. f/u HTN   Wt Readings from Last 3 Encounters:  04/26/15 110 lb (49.896 kg)  03/27/15 122 lb 5.7 oz (55.5 kg)  03/22/15 113 lb 2 oz (51.313 kg)       History of Present Illness: Mariah Phillips is a 74 y.o. female  with past medical history of chronic uncontrolled hypertension, hyperlipidemia, and history of questionable MI in 1984 percent was acute diastolic heart failure, hypokalemia.  She had a hysterectomy and then developed hydronephrosis.  She has recovered well.  She still has high blood pressure readings. They range from 140s to 150s more commonly up despite into the 180s. She notes that her swelling did not resolve after she stopped amlodipine. Her swelling only resolved after hydronephrosis resolved. She does not think that the amlodipine actually caused her swelling. She has been off of this class of medication since that time.      Past Medical History  Diagnosis Date  . Hypertension   . Uterine prolapse   . Hydronephrosis     bilateral  . Esophageal reflux   . Cystocele, midline   . Old myocardial infarction   . Hyperlipidemia   . Anemia     no blood transfusions in past  . Edema     lower extremities  . Foley catheter in place     hx. urinary retention 03-22-15 Foley cath remains in place.    Past Surgical History  Procedure Laterality Date  . Cataract extraction  2000/2010  . Tubal ligation    . Cholecystectomy      '84-open  . Eye surgery      retina surgery 2012  . Upper gastrointestinal endoscopy      with esophageal dilation  . Anterior and posterior repair N/A 03/27/2015    Procedure: ANTERIOR (CYSTOCELE)  REPAIR ;  Surgeon: Bjorn Loser, MD;  Location: [facility];  Service: Urology;  Laterality: N/A;  . Vaginal  prolapse repair N/A 03/27/2015    Procedure: VAGINAL VAULT SUSPENSION AND GRAFT;  Surgeon: Bjorn Loser, MD;  Location: [facility];  Service: Urology;  Laterality: N/A;  . Cystoscopy with retrograde urethrogram Bilateral 03/27/2015    Procedure: CYSTOSCOPY  ;  Surgeon: Bjorn Loser, MD;  Location: [facility];  Service: Urology;  Laterality: Bilateral;  . Vaginal hysterectomy N/A 03/27/2015    Procedure: HYSTERECTOMY VAGINAL;  Surgeon: Servando Salina, MD;  Location: [facility];  Service: Gynecology;  Laterality: N/A;  . Salpingoophorectomy Bilateral 03/27/2015    Procedure: SALPINGO OOPHORECTOMY;  Surgeon: Servando Salina, MD;  Location: [facility];  Service: Gynecology;  Laterality: Bilateral;     Current Outpatient Prescriptions  Medication Sig Dispense Refill  . carvedilol (COREG) 12.5 MG tablet Take 1 tablet (12.5 mg total) by mouth 2 (two) times daily. 60 tablet 2  . cloNIDine (CATAPRES) 0.2 MG tablet Take 1 tablet (0.2 mg total) by mouth 2 (two) times daily. 60 tablet 2  . hydrALAZINE (APRESOLINE) 50 MG tablet Take 1 tablet (50 mg total) by mouth every 8 (eight) hours. 90 tablet 11  . HYDROcodone-acetaminophen (NORCO) 5-325 MG tablet Take 1-2 tablets by mouth every 6 (six) hours as needed for moderate pain. 40 tablet 0  . omeprazole (PRILOSEC) 20  MG capsule Take 1 capsule by mouth every morning.   4  . pravastatin (PRAVACHOL) 20 MG tablet Take 20 mg by mouth every evening.      No current facility-administered medications for this visit.    Allergies:   Amlodipine    Social History:  The patient  reports that she quit smoking about 32 years ago. She has never used smokeless tobacco. She reports that she does not drink alcohol or use illicit drugs.   Family History:  The patient's family history includes Heart Problems in her father; Heart attack in her father; Hypertension in her sister; Stroke in her maternal grandmother; Thyroid cancer in her mother.    ROS:  Please see the history  of present illness.   Otherwise, review of systems are positive for anxiety with high blood pressure readings.   All other systems are reviewed and negative.    PHYSICAL EXAM: VS:  BP 182/50 mmHg  Pulse 66  Ht 4\' 7"  (1.397 m)  Wt 110 lb (49.896 kg)  BMI 25.57 kg/m2 , BMI Body mass index is 25.57 kg/(m^2). GEN: Well nourished, well developed, in no acute distress HEENT: normal Neck: no JVD, carotid bruits, or masses Cardiac: RRR; no murmurs, rubs, or gallops,no edema  Respiratory:  clear to auscultation bilaterally, normal work of breathing GI: soft, nontender, nondistended, + BS MS: no deformity or atrophy Skin: warm and dry, no rash Neuro:  Strength and sensation are intact Psych: euthymic mood, full affect   EKG:   The ekg ordered today demonstrates NSR LBBB   Recent Labs: 02/24/2015: B Natriuretic Peptide 400.4* 03/29/2015: ALT 31; BUN 19; Creatinine, Ser 1.20*; Hemoglobin 7.8*; Magnesium 2.0; Platelets 235; Potassium 2.8*; Sodium 140; TSH 0.592   Lipid Panel    Component Value Date/Time   CHOL 104 03/29/2015 0435   TRIG 91 03/29/2015 0435   HDL 44 03/29/2015 0435   CHOLHDL 2.4 03/29/2015 0435   VLDL 18 03/29/2015 0435   LDLCALC 42 03/29/2015 0435     Other studies Reviewed: Additional studies/ records that were reviewed today with results demonstrating: hospital records reviewed from 10/16..   ASSESSMENT AND PLAN:  1. HTN: Start amlodipine 5 mg daily.  She had swelling at the higher dose. I think amlodipine may help level out some of the spikes in her blood pressure that she has. We'll try to stay at 5 mg daily. Continue other doses of current medications. She does not feel that the hydralazine does much for her. We'll have her follow-up in 2 weeks with our Pharm.D. hypertension clinic. If her blood pressures are better, could try to wean off the hydralazine. Hopefully, she will not develop any swelling with amlodipine. If she does well, then would have to stop the  amlodipine.  2. I asked her not to check her blood pressure more than twice a day. I think some of the high readings contribute to her anxiety. 3. Recent negative stress test prior to GYN surgery.   Current medicines are reviewed at length with the patient today.  The patient concerns regarding her medicines were addressed.  The following changes have been made:  Start amlodipine 5 mg daily  Labs/ tests ordered today include:  No orders of the defined types were placed in this encounter.    Recommend 150 minutes/week of aerobic exercise Low fat, low carb, high fiber diet recommended  Disposition:   FU in 2 weeks with Pharm.D. hypertension clinic   Signed, Jettie Booze., MD  04/26/2015 3:22  PM    [facility] [place], [place], [place]  [address] Phone: [phone]; Fax: [phone]

## 2015-05-02 ENCOUNTER — Other Ambulatory Visit: Payer: Self-pay

## 2015-05-10 ENCOUNTER — Encounter: Payer: Self-pay | Admitting: Pharmacist

## 2015-05-10 ENCOUNTER — Ambulatory Visit (INDEPENDENT_AMBULATORY_CARE_PROVIDER_SITE_OTHER): Payer: BLUE CROSS/BLUE SHIELD | Admitting: Pharmacist

## 2015-05-10 VITALS — BP 132/58 | HR 57

## 2015-05-10 DIAGNOSIS — I10 Essential (primary) hypertension: Secondary | ICD-10-CM

## 2015-05-10 LAB — BASIC METABOLIC PANEL
BUN: 18 mg/dL (ref 7–25)
CALCIUM: 8.9 mg/dL (ref 8.6–10.4)
CO2: 25 mmol/L (ref 20–31)
Chloride: 105 mmol/L (ref 98–110)
Creat: 0.92 mg/dL (ref 0.60–0.93)
GLUCOSE: 108 mg/dL — AB (ref 65–99)
Potassium: 4.3 mmol/L (ref 3.5–5.3)
SODIUM: 138 mmol/L (ref 135–146)

## 2015-05-10 MED ORDER — CLONIDINE HCL 0.2 MG PO TABS: 0.2000 mg | ORAL_TABLET | Freq: Two times a day (BID) | ORAL | Status: DC

## 2015-05-10 MED ORDER — CARVEDILOL 12.5 MG PO TABS: 12.5000 mg | ORAL_TABLET | Freq: Two times a day (BID) | ORAL | Status: DC

## 2015-05-10 MED ORDER — CHLORTHALIDONE 25 MG PO TABS: 25.0000 mg | ORAL_TABLET | Freq: Every day | ORAL | Status: DC

## 2015-05-10 NOTE — Patient Instructions (Signed)
Stop taking your amlodipine We will recheck labs today - I'll call with the results later today. If everything looks ok, we will start chlorthalidone 25mg  once daily Follow up in blood pressure clinic in 3 weeks on Thursday, January 5 at 9:30am

## 2015-05-10 NOTE — Progress Notes (Signed)
Patient ID: Mariah Phillips                 DOB: November 25, 1940, 74 yo                         MRN: [id number]     HPI: Rexanna Trunnell is a 74 y.o. female patient of Dr. Irish Lack who presents for follow up to HTN clinic. She was seen in HTN clinic multiple times before her bladder tacking procedure and hysterectomy. Pt also had hospital admission in September for hydronephrosis. PMH also significant for HLD and old MI in 1984.  Since the procedure, pt's BP is still running high. Pt had a history of potential swelling due to amlodipine even with rechallenge. However, pt thought this could be due to her bladder issues. She was rechallenged again with amlodipine 2 weeks ago. Pt reports that swelling has returned since restarting amlodipine.   She has been doing her best to avoid checking her BP more than 3 times a day. Systolic readings as low as 130 at home, as high as 180. Usually 150s-160s per patient. Of note, have had pt bring in home cuff in the past which was 44mmHg higher than clinic reading.   Clinic BP reading: 132/58, pulse 57. Pt denies any symptoms of dizziness or lightheadedness at home. She is adherent to her medications and can very easily name her current medications and doses.  Current HTN meds: amlodipine 5mg , Coreg 12.5mg  BID, clonidine 0.2mg  BID, hydralazine 50mg  TID Previously tried: Swelling with amlodipine. Last time I saw pt in September, she was taking lisinopril and HCTZ. Both meds stopped in hospital, likely d/t electrolyte abnormalities with hydronephrosis. BP goal: <150/78mmHg. Systolic BP needs to be 123XX123 for pt to return to work (planned return in February 2017).  Family History: Heart problems in her father, thyroid cancer in her mother.  Social History: Patient reports that she has quit smoking. She does not have any smokeless tobacco history on file.  Diet: Patient admits that diet is poor - she likes eating chips and fast food. Have previously provided patient with low  sodium diet handout and discussed healthier options such as lean meats, whole grains, vegetables and fruit, and low fat dairy rather than processed foods to help control her BP.  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 Plans to return to work in February.  Labs: most recent BMET drawn when pt was in hospital last month. K was very low at 2.8, SCr was 1.2   Wt Readings from Last 3 Encounters:  04/26/15 110 lb (49.896 kg)  03/27/15 122 lb 5.7 oz (55.5 kg)  03/22/15 113 lb 2 oz (51.313 kg)   BP Readings from Last 3 Encounters:  04/26/15 182/50  03/29/15 145/57  03/22/15 179/56   Pulse Readings from Last 3 Encounters:  04/26/15 66  03/29/15 63  03/22/15 66    Renal function: CrCl cannot be calculated (Patient has no serum creatinine result on file.).  Past Medical History  Diagnosis Date  . Hypertension   . Uterine prolapse   . Hydronephrosis     bilateral  . Esophageal reflux   . Cystocele, midline   . Old myocardial infarction   . Hyperlipidemia   . Anemia     no blood transfusions in past  . Edema     lower extremities  . Foley catheter in place     hx.  urinary retention 03-22-15 Foley cath remains in place.    Current Outpatient Prescriptions on File Prior to Visit  Medication Sig Dispense Refill  . amLODipine (NORVASC) 5 MG tablet Take 1 tablet (5 mg total) by mouth daily. 30 tablet 6  . carvedilol (COREG) 12.5 MG tablet Take 1 tablet (12.5 mg total) by mouth 2 (two) times daily. 60 tablet 2  . cloNIDine (CATAPRES) 0.2 MG tablet Take 1 tablet (0.2 mg total) by mouth 2 (two) times daily. 60 tablet 2  . hydrALAZINE (APRESOLINE) 50 MG tablet Take 1 tablet (50 mg total) by mouth every 8 (eight) hours. 90 tablet 11  . HYDROcodone-acetaminophen (NORCO) 5-325 MG tablet Take 1-2 tablets by mouth every 6 (six) hours as needed for moderate pain. 40 tablet 0  . omeprazole (PRILOSEC) 20 MG capsule Take 1 capsule by mouth  every morning.   4  . pravastatin (PRAVACHOL) 20 MG tablet Take 20 mg by mouth every evening.      No current facility-administered medications on file prior to visit.    Allergies  Allergen Reactions  . Amlodipine Swelling    Lower extremity edema     Assessment/Plan:  1. Hypertension - BP at goal <150/66mmHg in clinic today, however pt still reports home readings are elevated. Will d/c amlodipine since pt has had lower extremity swelling again with 3rd rechallenge. Rechecked BMET today - everything stable. Will start chlorthalidone 25mg  daily. Pt will continue to check BP at home, advised no more than 3 times daily. Refills sent for Coreg and clonidine. Will follow up in BP clinic in 3 weeks.   Megan E. Supple, PharmD [facility] [id number] [place], [place] [address] Phone: [phone]; Fax: [phone] 05/10/2015 10:58 AM

## 2015-05-31 ENCOUNTER — Ambulatory Visit (INDEPENDENT_AMBULATORY_CARE_PROVIDER_SITE_OTHER): Payer: BLUE CROSS/BLUE SHIELD | Admitting: Pharmacist

## 2015-05-31 ENCOUNTER — Encounter: Payer: Self-pay | Admitting: Pharmacist

## 2015-05-31 VITALS — BP 152/78 | HR 61

## 2015-05-31 DIAGNOSIS — I10 Essential (primary) hypertension: Secondary | ICD-10-CM

## 2015-05-31 MED ORDER — CHLORTHALIDONE 50 MG PO TABS: 50.0000 mg | ORAL_TABLET | Freq: Every day | ORAL | Status: DC

## 2015-05-31 NOTE — Patient Instructions (Addendum)
Increase chlorthalidone to 50mg  once daily Continue other blood pressure medications as directed (carvedilol, clonidine, and hydralazine)  Recheck blood pressure in clinic in 2 weeks on 1/19 at 9:30am. Bring your blood pressure cuff with you

## 2015-05-31 NOTE — Progress Notes (Signed)
Patient ID: [name] DOB: Feb 12, 1941, 75 yo MRN: [id number]     HPI: Mariah Phillips is a 75 y.o. female patient of Dr. Irish Lack who presents for follow up to HTN clinic. She was seen in HTN clinic multiple times before her bladder tacking procedure and hysterectomy. Pt also had hospital admission in September for hydronephrosis. PMH also significant for HLD and old MI in 1984.  Since the procedure, pt's BP is still running high at home. Pt had a history of swelling due to amlodipine - this has occurred with 3 rechallenges. She has been doing her best to avoid checking her BP more than 3 times a day. Systolic readings as low as 126 at home, as high as 182. Usually 150s-160s per patient. She reports that her BP improves the most about an hour after she takes both her clonidine and carvedilol. Of note, have had pt bring in home cuff in the past which was 41mmHg higher than clinic reading.   At last visit, amlodipine was stopped and chlorthalidone 25mg  was started. Patient reports that swelling has improved but that she still has a little fluid around her ankles.  Clinic BP reading: 152/78, pulse 61. Pt denies any symptoms of dizziness or lightheadedness at home. She is adherent to her medications and can very easily name her current medications and doses.  Current HTN meds: chlorthalidone 25mg  daily, Coreg 12.5mg  BID, clonidine 0.2mg  BID, hydralazine 50mg  TID Previously tried: Swelling with amlodipine. Last time I saw pt in September, she was taking lisinopril and HCTZ. Both meds stopped in hospital, likely d/t electrolyte abnormalities with hydronephrosis. BP goal: <150/65mmHg. Systolic BP needs to be 123XX123 for pt to return to work (planned return in February 2017).  Family History: Heart problems in her father, thyroid cancer in her mother.  Social History: Patient reports that she has quit smoking. She does not have any smokeless tobacco history on  file.  Diet: Patient admits that diet is poor - she likes eating chips and fast food. Have previously provided patient with low sodium diet handout and discussed healthier options such as lean meats, whole grains, vegetables and fruit, and low fat dairy rather than processed foods to help control her BP.  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 Plans to return to work in February.  Labs: most recent BMET drawn when pt was in hospital last month. K was very low at 2.8, SCr was 1.2   Wt Readings from Last 3 Encounters:  04/26/15 110 lb (49.896 kg)  03/27/15 122 lb 5.7 oz (55.5 kg)  03/22/15 113 lb 2 oz (51.313 kg)   BP Readings from Last 3 Encounters:  05/31/15 152/78  05/10/15 132/58  04/26/15 182/50   Pulse Readings from Last 3 Encounters:  05/31/15 61  05/10/15 57  04/26/15 66    Renal function: CrCl cannot be calculated (Unknown ideal weight.).  Past Medical History  Diagnosis Date  . Hypertension   . Uterine prolapse   . Hydronephrosis     bilateral  . Esophageal reflux   . Cystocele, midline   . Old myocardial infarction   . Hyperlipidemia   . Anemia     no blood transfusions in past  . Edema     lower extremities  . Foley catheter in place     hx. urinary retention 03-22-15 Foley cath remains in place.    Current Outpatient Prescriptions on File Prior to Visit  Medication Sig  Dispense Refill  . carvedilol (COREG) 12.5 MG tablet Take 1 tablet (12.5 mg total) by mouth 2 (two) times daily. 60 tablet 5  . cloNIDine (CATAPRES) 0.2 MG tablet Take 1 tablet (0.2 mg total) by mouth 2 (two) times daily. 60 tablet 2  . hydrALAZINE (APRESOLINE) 50 MG tablet Take 1 tablet (50 mg total) by mouth every 8 (eight) hours. 90 tablet 11  . HYDROcodone-acetaminophen (NORCO) 5-325 MG tablet Take 1-2 tablets by mouth every 6 (six) hours as needed for moderate pain. 40 tablet 0  . omeprazole (PRILOSEC) 20 MG capsule Take 1  capsule by mouth every morning.   4  . pravastatin (PRAVACHOL) 20 MG tablet Take 20 mg by mouth every evening.      No current facility-administered medications on file prior to visit.    Allergies  Allergen Reactions  . Amlodipine Swelling    Lower extremity edema - has occurred with 3 rechallenges.     Assessment/Plan:  1. Hypertension - BP was at goal <150/70mmHg at last visit and is very close to goal today. However, pt reports that home readings are always higher with systolic readings trending 150s-170s. She would like her BP lower before returning to work as well. Will increase chlorthalidone to help with residual swelling and to help lower BP. HR of 61 limiting titration of Coreg. She will try not to check her BP more than 3-4 times a day at home. Will f/u with patient in 2 weeks. Asked her to bring her home BP cuff with her at next visit since in the past it has measured ~73mmHg higher than clinic readings.   Megan E. Supple, PharmD [facility] [id number] [place], [place] [address] Phone: [phone]; Fax: [phone] 05/31/2015 11:21 AM

## 2015-06-08 ENCOUNTER — Other Ambulatory Visit: Payer: Self-pay

## 2015-06-14 ENCOUNTER — Ambulatory Visit (INDEPENDENT_AMBULATORY_CARE_PROVIDER_SITE_OTHER): Payer: BLUE CROSS/BLUE SHIELD | Admitting: Pharmacist

## 2015-06-14 VITALS — BP 152/58 | HR 58

## 2015-06-14 DIAGNOSIS — I10 Essential (primary) hypertension: Secondary | ICD-10-CM

## 2015-06-14 MED ORDER — LISINOPRIL 10 MG PO TABS: 10.0000 mg | ORAL_TABLET | Freq: Every day | ORAL | Status: DC

## 2015-06-14 NOTE — Patient Instructions (Addendum)
AM medications: -Carvedilol 12.5mg  -Clonidine 0.2mg  -Hydralazine 50mg   Afternoon medications: -Hydralazine 50mg  -Start taking lisinopril 10mg **  PM medications: -chlorthalidone 50mg  daily -Carvedilol 12.5mg  -Clonidine 0.2mg  -Hydralazine 50mg   If your heart rate is in the 40s, do not take your dose of carvedilol  Follow up in blood pressure clinic in 3 weeks on Friday, February 10th at 3pm

## 2015-06-14 NOTE — Progress Notes (Signed)
Patient ID: Mariah Phillips                 DOB: 1940/12/25, 75 yo                         MRN: [id number]     HPI: Mariah Phillips is a 75 y.o. female patient of Dr. Irish Lack who presents for follow up to HTN clinic. She was seen in HTN clinic multiple times before her bladder tacking procedure and hysterectomy. Pt also had hospital admission in September for hydronephrosis. PMH also significant for HLD and old MI in 1984.  Since the procedure, pt's BP is still running high at home. Pt had a history of swelling due to amlodipine - this has occurred with 3 rechallenges. She has been doing her best to avoid checking her BP more than 3 times a day. Systolic readings as low as 126 at home, as high as 186. Usually 140s-160s per patient. She reports that her BP improves the most about an hour after she takes both her clonidine and carvedilol. Since d/cing amlodipine and titrating chlorthalidone up to 50mg  daily, pt reports that all lower extremity swelling is gone.  Of note, have had pt bring in home cuff in the past which was 34mmHg higher than clinic reading. Rechecked this today as well - Clinic BP reading: 152/58, pulse 58. Rechecked with pt's home cuff in clinic: 153/58, pulse 57. Pt denies any symptoms of dizziness or lightheadedness at home. She is adherent to her medications and can very easily name her current medications and doses.  Current HTN meds: chlorthalidone 50mg  daily, Coreg 12.5mg  BID, clonidine 0.2mg  BID, hydralazine 50mg  TID Previously tried: Swelling with amlodipine. Last time I saw pt in September, she was taking lisinopril and HCTZ. Both meds stopped in hospital, likely d/t electrolyte abnormalities with hydronephrosis. BP goal: <150/45mmHg. Systolic BP needs to be [id number] for pt to return to work (planned return in February 2017).  Family History: Heart problems in her father, thyroid cancer in her mother.  Social History: Patient reports that she has quit smoking. She does not have any  smokeless tobacco history on file.  Diet: Patient admits that diet is poor - she likes eating chips and fast food. Have previously provided patient with low sodium diet handout and discussed healthier options such as lean meats, whole grains, vegetables and fruit, and low fat dairy rather than processed foods to help control her BP.  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 Plans to return to work in February.  Labs: 05/10/15: SCr 0.92, K 4.3 - stable since hospital d/c  Wt Readings from Last 3 Encounters:  04/26/15 110 lb (49.896 kg)  03/27/15 122 lb 5.7 oz (55.5 kg)  03/22/15 113 lb 2 oz (51.313 kg)   BP Readings from Last 3 Encounters:  06/14/15 152/58  05/31/15 152/78  05/10/15 132/58   Pulse Readings from Last 3 Encounters:  06/14/15 58  05/31/15 61  05/10/15 57    Renal function: CrCl cannot be calculated (Unknown ideal weight.).  Past Medical History  Diagnosis Date  . Hypertension   . Uterine prolapse   . Hydronephrosis     bilateral  . Esophageal reflux   . Cystocele, midline   . Old myocardial infarction   . Hyperlipidemia   . Anemia     no blood transfusions in past  . Edema     lower  extremities  . Foley catheter in place     hx. urinary retention 03-22-15 Foley cath remains in place.    Current Outpatient Prescriptions on File Prior to Visit  Medication Sig Dispense Refill  . carvedilol (COREG) 12.5 MG tablet Take 1 tablet (12.5 mg total) by mouth 2 (two) times daily. 60 tablet 5  . chlorthalidone (HYGROTON) 50 MG tablet Take 1 tablet (50 mg total) by mouth daily. 30 tablet 11  . cloNIDine (CATAPRES) 0.2 MG tablet Take 1 tablet (0.2 mg total) by mouth 2 (two) times daily. 60 tablet 2  . ferrous sulfate 325 (65 FE) MG EC tablet Take 325 mg by mouth daily.    . hydrALAZINE (APRESOLINE) 50 MG tablet Take 1 tablet (50 mg total) by mouth every 8 (eight) hours. 90 tablet 11  . omeprazole (PRILOSEC) 20  MG capsule Take 1 capsule by mouth every morning.   4  . pravastatin (PRAVACHOL) 20 MG tablet Take 20 mg by mouth every evening.      No current facility-administered medications on file prior to visit.    Allergies  Allergen Reactions  . Amlodipine Swelling    Lower extremity edema - has occurred with 3 rechallenges.     Assessment/Plan:  1. Hypertension - pt slightly above goal <150/64mmHg. Per reported home readings, ~50% of the time she is above goal. BP is checked at work and she will be returning in the next few weeks. Will initiate lisinopril 10mg  daily and continue all other BP meds as directed. Pt will continue to monitor BP at home no more than 3x per day and will f/u in HTN clinic again in 3 weeks. Will check BMET at that time since ACEi restarted.   Shalen Petrak E. Wandalee Klang, PharmD, [place] [id number] [address], [place], [place] [address] Phone: [phone]; Fax: [phone] 06/14/2015 12:18 PM

## 2015-07-06 ENCOUNTER — Ambulatory Visit (INDEPENDENT_AMBULATORY_CARE_PROVIDER_SITE_OTHER): Payer: BLUE CROSS/BLUE SHIELD | Admitting: Pharmacist

## 2015-07-06 VITALS — BP 144/58 | HR 57

## 2015-07-06 DIAGNOSIS — I10 Essential (primary) hypertension: Secondary | ICD-10-CM

## 2015-07-06 LAB — BASIC METABOLIC PANEL
BUN: 36 mg/dL — AB (ref 7–25)
CHLORIDE: 103 mmol/L (ref 98–110)
CO2: 23 mmol/L (ref 20–31)
CREATININE: 1.34 mg/dL — AB (ref 0.60–0.93)
Calcium: 9 mg/dL (ref 8.6–10.4)
Glucose, Bld: 115 mg/dL — ABNORMAL HIGH (ref 65–99)
POTASSIUM: 3.9 mmol/L (ref 3.5–5.3)
Sodium: 136 mmol/L (ref 135–146)

## 2015-07-06 NOTE — Progress Notes (Signed)
Patient ID: Mariah Phillips                 DOB: 1941-03-07, 75 yo                         MRN: [id number]     HPI: Mariah Phillips is a 75 y.o. female patient of Dr. Irish Lack who presents for follow up to HTN clinic. She was seen in HTN clinic multiple times before her bladder tacking procedure and hysterectomy. Pt also had hospital admission in September for hydronephrosis. PMH also significant for HLD and old MI in 1984.  After the procedure, pt's BP was running high at home. Pt had a history of swelling due to amlodipine - this has occurred with 3 rechallenges. She has been doing her best to avoid checking her BP more than 3 times a day. Systolic readings as low as 106 at home, as high as 166. Usually 120s-140s per patient since starting lisinopril 10mg  at last HTN visit. This is a huge improvement over her last readings. Swelling d/t amlodipine is gone.   She reports having decreased heart rates as low as 45 and stopped taking her carvedilol on 1/24 without consulting [place]. Prior to that, she had held her dose 4-5 times. She keeps a detailed log of her BP readings and surprisingly her HR and BP stayed well controlled after abrupt DC of carvedilol. She reports that she has been feeling fine.  Of note, have had pt bring in home cuff in the past which was 10mmHg higher than clinic reading. Pt denies any symptoms of dizziness or lightheadedness at home. She is adherent to her medications and can very easily name her current medications and doses.  BP in clinic today: 144/58; HR 57  Current HTN meds: chlorthalidone 50mg  daily, clonidine 0.2mg  BID, hydralazine 50mg  TID, lisinopril 10 mg daily Previously tried: Swelling with amlodipine. Last time I saw pt in September, she was taking lisinopril and HCTZ. Both meds stopped in hospital, likely d/t electrolyte abnormalities with hydronephrosis. BP goal: <150/90   Family History: Heart problems in her father, thyroid cancer in her mother.  Social History:  Patient reports that she has quit smoking. She does not have any smokeless tobacco history on file.  Diet: Patient admits that diet is poor - she likes eating chips and fast food. Have previously provided patient with low sodium diet handout and discussed healthier options such as lean meats, whole grains, vegetables and fruit, and low fat dairy rather than processed foods to help control her BP.  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 She returned to work last week.   Home BP readings: mostly in the 120-140s/50s-60s  Labs: 05/10/15: SCr 0.92, K 4.3 - stable since hospital d/c  Wt Readings from Last 3 Encounters:  04/26/15 110 lb (49.896 kg)  03/27/15 122 lb 5.7 oz (55.5 kg)  03/22/15 113 lb 2 oz (51.313 kg)   BP Readings from Last 3 Encounters:  06/14/15 152/58  05/31/15 152/78  05/10/15 132/58   Pulse Readings from Last 3 Encounters:  06/14/15 58  05/31/15 61  05/10/15 57    Renal function: CrCl cannot be calculated (Unknown ideal weight.).  Past Medical History  Diagnosis Date  . Hypertension   . Uterine prolapse   . Hydronephrosis     bilateral  . Esophageal reflux   . Cystocele, midline   . Old myocardial infarction   .  Hyperlipidemia   . Anemia     no blood transfusions in past  . Edema     lower extremities  . Foley catheter in place     hx. urinary retention 03-22-15 Foley cath remains in place.    Current Outpatient Prescriptions on File Prior to Visit  Medication Sig Dispense Refill  . carvedilol (COREG) 12.5 MG tablet Take 1 tablet (12.5 mg total) by mouth 2 (two) times daily. 60 tablet 5  . chlorthalidone (HYGROTON) 50 MG tablet Take 1 tablet (50 mg total) by mouth daily. 30 tablet 11  . cloNIDine (CATAPRES) 0.2 MG tablet Take 1 tablet (0.2 mg total) by mouth 2 (two) times daily. 60 tablet 2  . ferrous sulfate 325 (65 FE) MG EC tablet Take 325 mg by mouth daily.    . hydrALAZINE (APRESOLINE) 50  MG tablet Take 1 tablet (50 mg total) by mouth every 8 (eight) hours. 90 tablet 11  . lisinopril (PRINIVIL,ZESTRIL) 10 MG tablet Take 1 tablet (10 mg total) by mouth daily. 30 tablet 11  . omeprazole (PRILOSEC) 20 MG capsule Take 1 capsule by mouth every morning.   4  . pravastatin (PRAVACHOL) 20 MG tablet Take 20 mg by mouth every evening.     . solifenacin (VESICARE) 5 MG tablet Take 5 mg by mouth daily.     No current facility-administered medications on file prior to visit.    Allergies  Allergen Reactions  . Amlodipine Swelling    Lower extremity edema - has occurred with 3 rechallenges.     Assessment/Plan: 1. Hypertension - pt at goal BP <150/78mmHg. Home readings are much improved since starting lisinopril a month ago. She discontinued carvedilol due to consistent low heart rate in the 40s - her BP and HR have stayed controlled since her self d/c. Will not make any medication changes at this time. Pt will continue to monitor BP at home no more than 3x per day - she will call clinic if her BP readings start to trend up. Will check BMET today d/t recent addition of ACEi.    Celesta Funderburk E. Rosine Solecki, PharmD, [place] [id number] [address] [place], [place] [address] Phone: [phone]; Fax: [phone] 07/06/2015 3:42 PM

## 2015-07-06 NOTE — Patient Instructions (Signed)
Updated medication list:  7:30am Clonidine Hydralazine  3pm Hydralazine  6:30pm Clonidine Pravastatin  10:30pm Hydralazine Chlorthalidone Iron Lisinopril Vesicare Stool softener

## 2015-07-09 ENCOUNTER — Telehealth: Payer: Self-pay | Admitting: Pharmacist

## 2015-07-09 DIAGNOSIS — I10 Essential (primary) hypertension: Secondary | ICD-10-CM

## 2015-07-09 NOTE — Telephone Encounter (Signed)
Called pt to discuss lab results from Friday - her SCr has increased from baseline. 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 Will have pt d/c chlorthalidone and f/u with BMET and HTN visit this Friday.

## 2015-07-13 ENCOUNTER — Other Ambulatory Visit (INDEPENDENT_AMBULATORY_CARE_PROVIDER_SITE_OTHER): Payer: BLUE CROSS/BLUE SHIELD | Admitting: *Deleted

## 2015-07-13 ENCOUNTER — Ambulatory Visit (INDEPENDENT_AMBULATORY_CARE_PROVIDER_SITE_OTHER): Payer: BLUE CROSS/BLUE SHIELD | Admitting: Pharmacist

## 2015-07-13 VITALS — BP 144/72

## 2015-07-13 DIAGNOSIS — I10 Essential (primary) hypertension: Secondary | ICD-10-CM

## 2015-07-13 LAB — BASIC METABOLIC PANEL
BUN: 21 mg/dL (ref 7–25)
CALCIUM: 8.9 mg/dL (ref 8.6–10.4)
CHLORIDE: 100 mmol/L (ref 98–110)
CO2: 25 mmol/L (ref 20–31)
Creat: 0.95 mg/dL — ABNORMAL HIGH (ref 0.60–0.93)
GLUCOSE: 129 mg/dL — AB (ref 65–99)
POTASSIUM: 3.8 mmol/L (ref 3.5–5.3)
SODIUM: 134 mmol/L — AB (ref 135–146)

## 2015-07-13 NOTE — Progress Notes (Signed)
Patient ID: Mariah Phillips                 DOB: Sep 16, 1940, 75 yo                         MRN: [id number]     HPI: Mariah Phillips is a 75 y.o. female patient of Dr. Irish Lack who presents for follow up to HTN clinic. She was seen in HTN clinic multiple times before her bladder tacking procedure and hysterectomy. Pt also had hospital admission in September for hydronephrosis. PMH also significant for HLD and old MI in 1984.  Pt had a repeat BMET on 07/06/15 that showed a >30% increase in her SCr over the past 2 months.  With the significant increase, she was asked to stop her chlorthalidone and come in today for repeat BP check and BMET.  She states she has not noticed a large difference in her BP at home since stopping chlorthalidone.  Her systolics have been [id number].  She did change overactive bladder medications yesterday and seen an increase in her HR but still controlled.    Of note, have had pt bring in home cuff in the past which was 8mmHg higher than clinic reading. Pt denies any symptoms of dizziness or lightheadedness at home. She is adherent to her medications and can very easily name her current medications and doses.  BP in clinic today: 144/72   Current HTN meds: clonidine 0.2mg  BID, hydralazine 50mg  TID, lisinopril 10 mg daily Previously tried: Swelling with amlodipine. Last time I saw pt in September, she was taking lisinopril and HCTZ. Both meds stopped in hospital, likely d/t electrolyte abnormalities with hydronephrosis. Chlortalidone d/c due to increase in SCr  BP goal: <150/90   Family History: Heart problems in her father, thyroid cancer in her mother.  Social History: Patient reports that she has quit smoking. She does not have any smokeless tobacco history on file.  Diet: Patient admits that diet is poor - she likes eating chips and fast food. Have previously provided patient with low sodium diet handout and discussed healthier options such as lean meats, whole grains,  vegetables and fruit, and low fat dairy rather than processed foods to help control her BP.  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   Home BP readings: mostly in the 120-140s/50s-60s  Labs: 07/06/15: SCr 1.34, K- 3.9 05/10/15: SCr 0.92, K 4.3 - stable since hospital d/c  Wt Readings from Last 3 Encounters:  04/26/15 110 lb (49.896 kg)  03/27/15 122 lb 5.7 oz (55.5 kg)  03/22/15 113 lb 2 oz (51.313 kg)   BP Readings from Last 3 Encounters:  07/13/15 144/72  07/06/15 144/58  06/14/15 152/58   Pulse Readings from Last 3 Encounters:  07/06/15 57  06/14/15 58  05/31/15 61    Renal function: CrCl cannot be calculated (Unknown ideal weight.).  Past Medical History  Diagnosis Date  . Hypertension   . Uterine prolapse   . Hydronephrosis     bilateral  . Esophageal reflux   . Cystocele, midline   . Old myocardial infarction   . Hyperlipidemia   . Anemia     no blood transfusions in past  . Edema     lower extremities  . Foley catheter in place     hx. urinary retention 03-22-15 Foley cath remains in place.  Current Outpatient Prescriptions on File Prior to Visit  Medication Sig Dispense Refill  . cloNIDine (CATAPRES) 0.2 MG tablet Take 1 tablet (0.2 mg total) by mouth 2 (two) times daily. 60 tablet 2  . ferrous sulfate 325 (65 FE) MG EC tablet Take 325 mg by mouth daily.    . hydrALAZINE (APRESOLINE) 50 MG tablet Take 1 tablet (50 mg total) by mouth every 8 (eight) hours. 90 tablet 11  . lisinopril (PRINIVIL,ZESTRIL) 10 MG tablet Take 1 tablet (10 mg total) by mouth daily. 30 tablet 11  . omeprazole (PRILOSEC) 20 MG capsule Take 1 capsule by mouth every morning.   4  . pravastatin (PRAVACHOL) 20 MG tablet Take 20 mg by mouth every evening.      No current facility-administered medications on file prior to visit.    Allergies  Allergen Reactions  . Amlodipine Swelling      Lower extremity edema - has occurred with 3 rechallenges.     Assessment/Plan: 1. Hypertension - pt at goal BP <150/71mmHg. Repeat BMET drawn today.  Will follow up with patient once we have results from BMET .    Elberta Leatherwood, PharmD, BCPS, [place] [id number] [address] [place], [place] [address] Phone: [phone]; Fax: [phone] 07/13/2015 9:27 AM   Addendum:  Reviewed labs from 2/17.  SCr back to baseline after medication changes.  Will not restart chorthalidone at this time.  Pt to keep checking BP and call us if it is consistently > 150/90.  Will recheck BP in 1 month.

## 2015-08-10 ENCOUNTER — Other Ambulatory Visit (INDEPENDENT_AMBULATORY_CARE_PROVIDER_SITE_OTHER): Payer: BLUE CROSS/BLUE SHIELD | Admitting: *Deleted

## 2015-08-10 DIAGNOSIS — I10 Essential (primary) hypertension: Secondary | ICD-10-CM

## 2015-08-10 LAB — BASIC METABOLIC PANEL
BUN: 14 mg/dL (ref 7–25)
CALCIUM: 9.3 mg/dL (ref 8.6–10.4)
CO2: 26 mmol/L (ref 20–31)
Chloride: 104 mmol/L (ref 98–110)
Creat: 0.88 mg/dL (ref 0.60–0.93)
Glucose, Bld: 86 mg/dL (ref 65–99)
POTASSIUM: 4.4 mmol/L (ref 3.5–5.3)
SODIUM: 138 mmol/L (ref 135–146)

## 2015-09-03 ENCOUNTER — Other Ambulatory Visit: Payer: Self-pay | Admitting: Interventional Cardiology

## 2015-11-23 IMAGING — US US RENAL
1 series · 14 of 25 positions shown · non-contrast
Comparison: CT abdomen and pelvis 12/22/2014.

CLINICAL DATA: Abnormal renal function tests.  Hypoproteinemia.

EXAM:
RENAL / URINARY TRACT ULTRASOUND COMPLETE

[Series 1: us renal · 0.22mm/px · 14 of 27 slices shown]
[im 1/27]
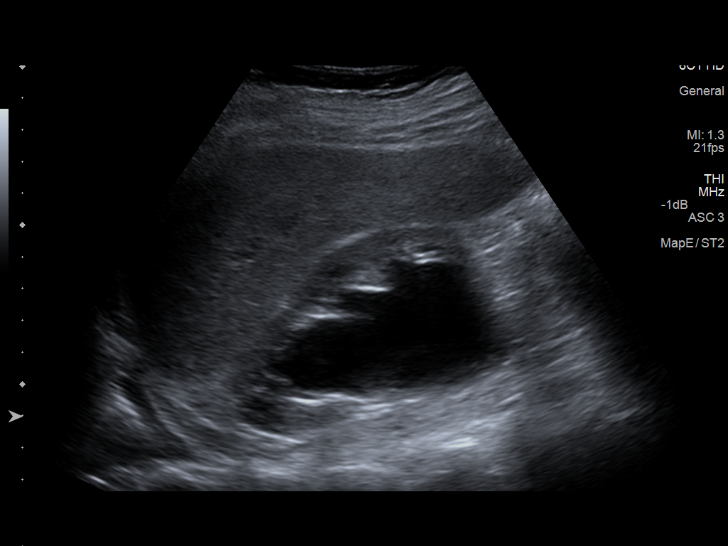
[im 3/27]
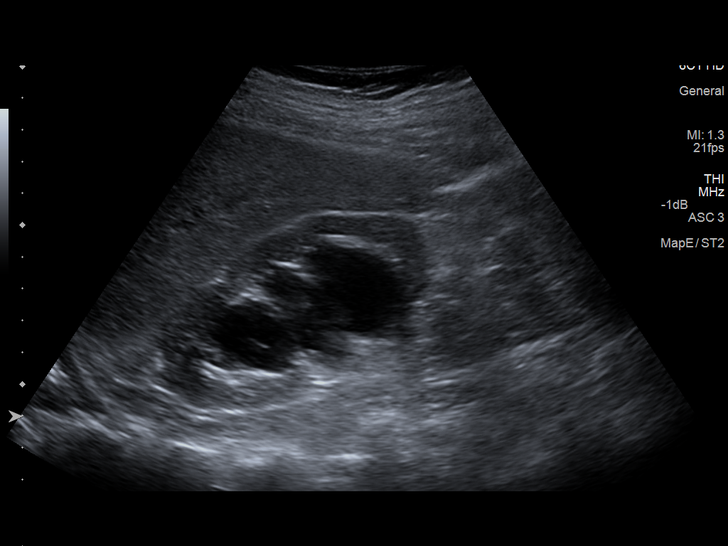
[im 5/27]
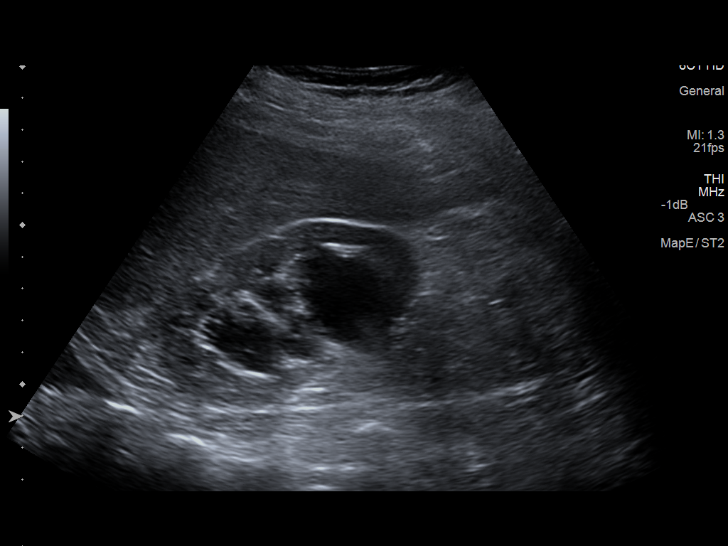
[im 7/27]
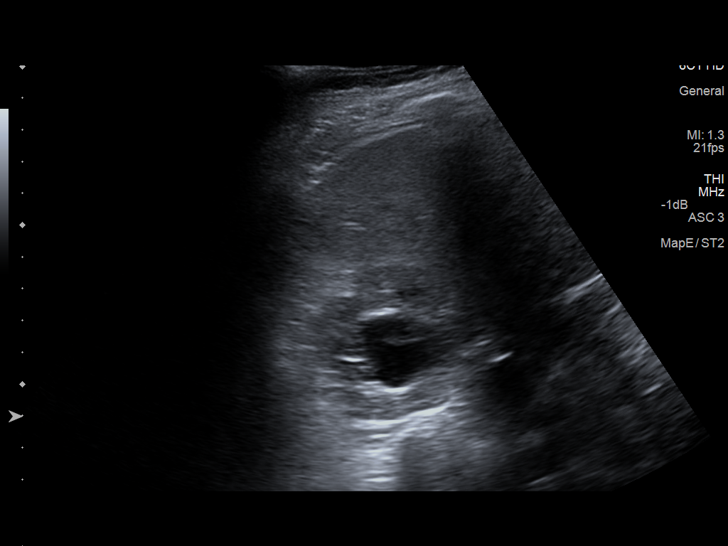
[im 9/27]
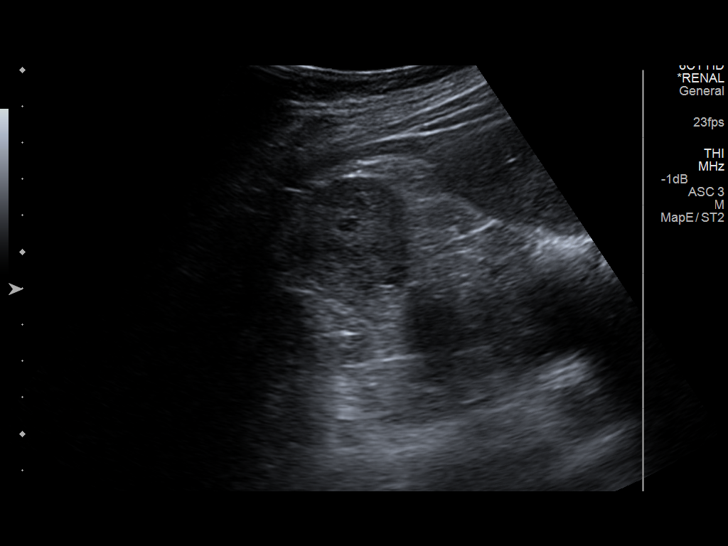
[im 10/27]
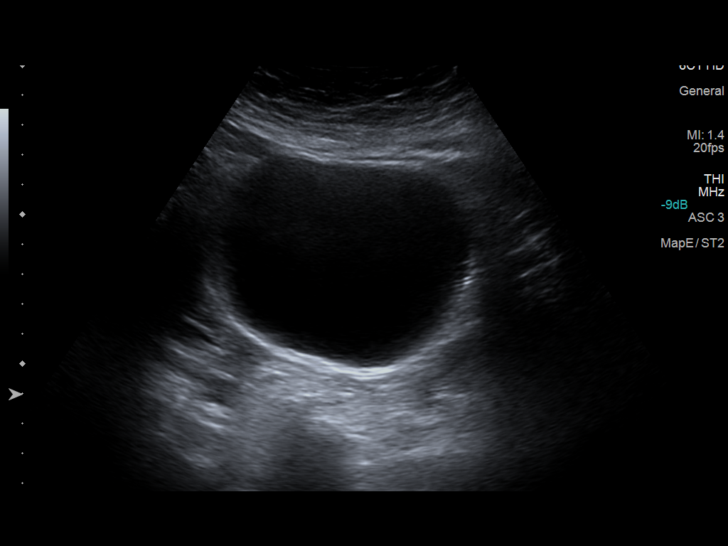
[im 12/27]
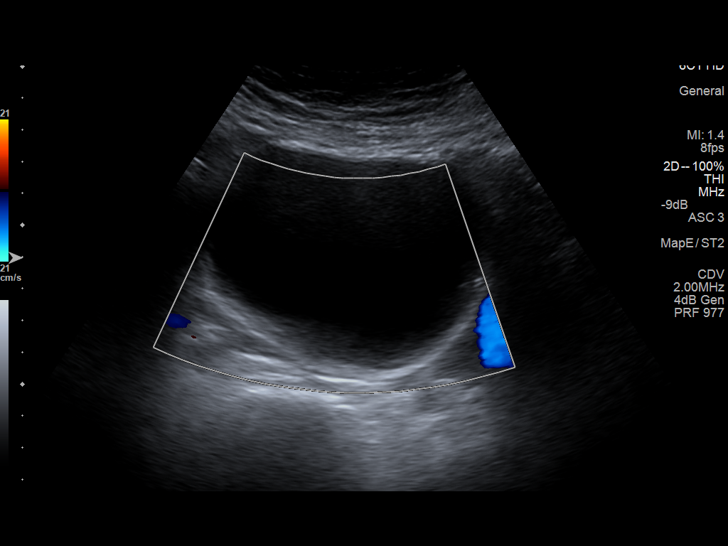
[im 15/27]
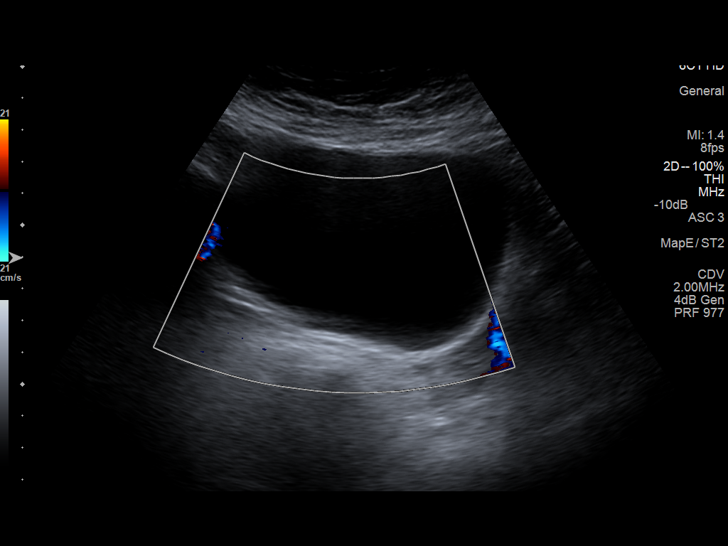
[im 17/27]
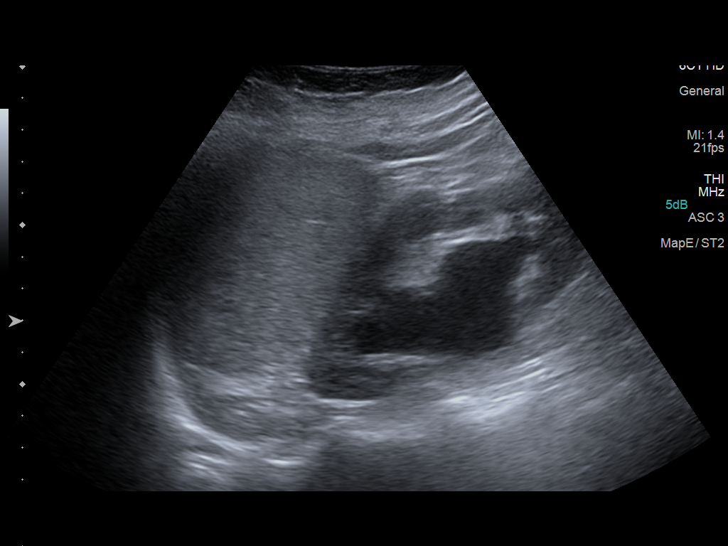
[im 18/27]
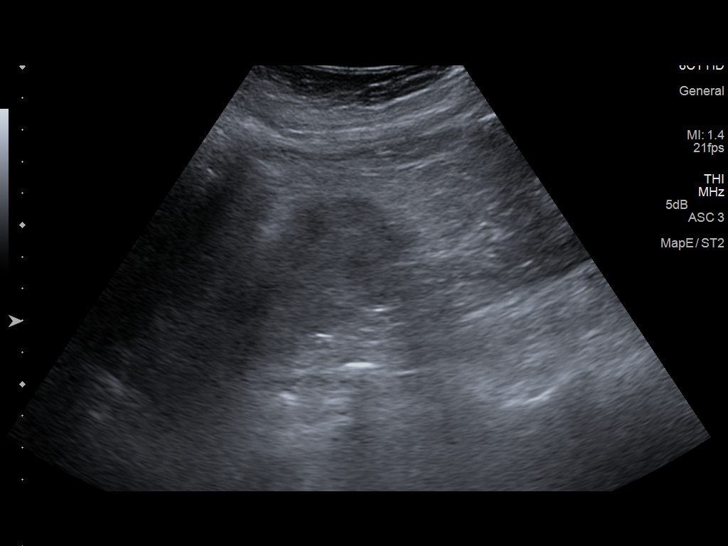
[im 20/27]
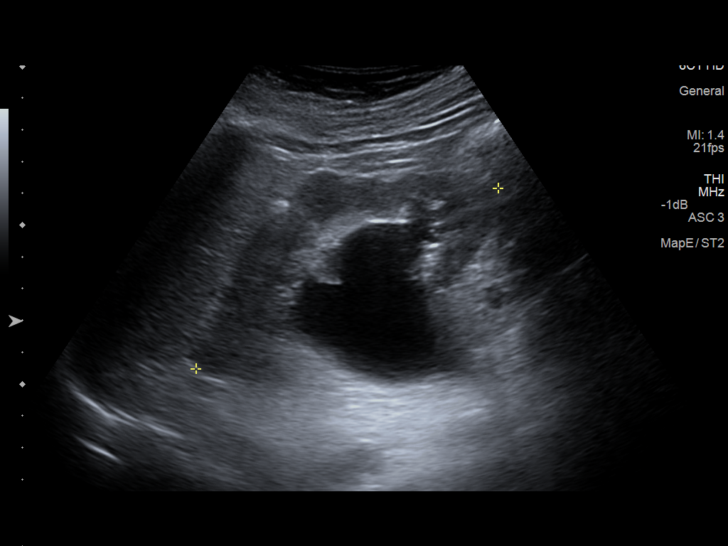
[im 22/27]
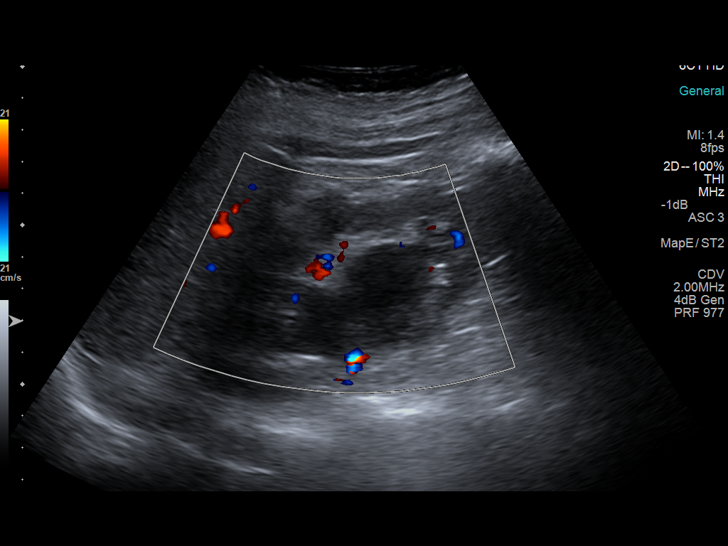
[im 24/27]
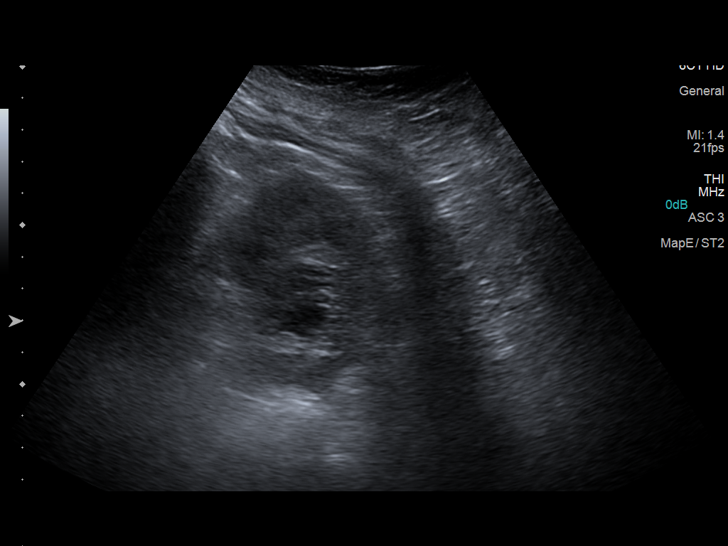
[im 27/27]
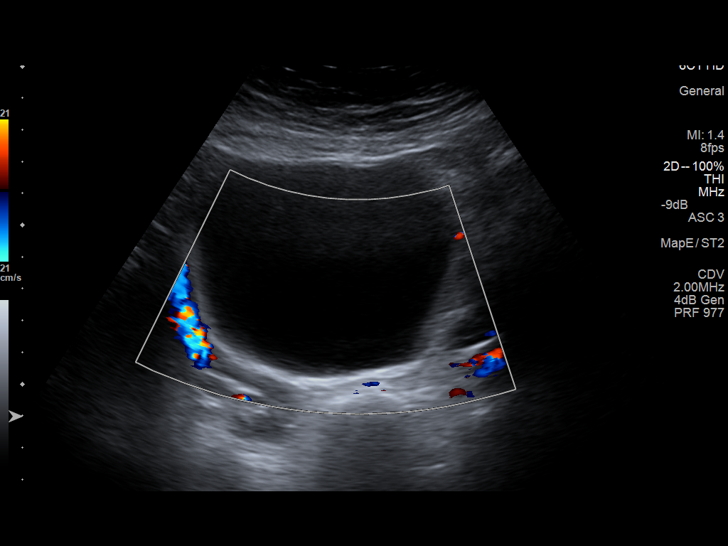

[14 of 25 positions shown; findings below may reference images not displayed]

FINDINGS: Right Kidney:

Length: 9.5 cm.. Echogenicity within normal limits. There is severe
hydronephrosis as seen on the comparison examination. No mass
visualized.

Left Kidney:

Length: 11.1 cm. Echogenicity within normal limits. There is severe
hydronephrosis is seen on the comparison examination. No mass
visualized.

Bladder:

Appears normal for degree of bladder distention. Ureteral jets are
not identified.
IMPRESSION: Persistent severe bilateral hydronephrosis. Cause for obstruction is
not identified. Prior CT scan raised the possibility of obstruction
secondary to pelvic floor laxity.

## 2015-11-25 IMAGING — US US RENAL
1 series · 14 of 25 positions shown · non-contrast
Comparison: Renal ultrasound performed 02/22/2015, and CT of the
abdomen and pelvis performed 12/22/2014

CLINICAL DATA: Follow-up known severe bilateral hydronephrosis.
Subsequent encounter.

EXAM:
RENAL / URINARY TRACT ULTRASOUND COMPLETE

[Series 1: us renal · 0.23mm/px · 14 of 34 slices shown]
[im 1/34]
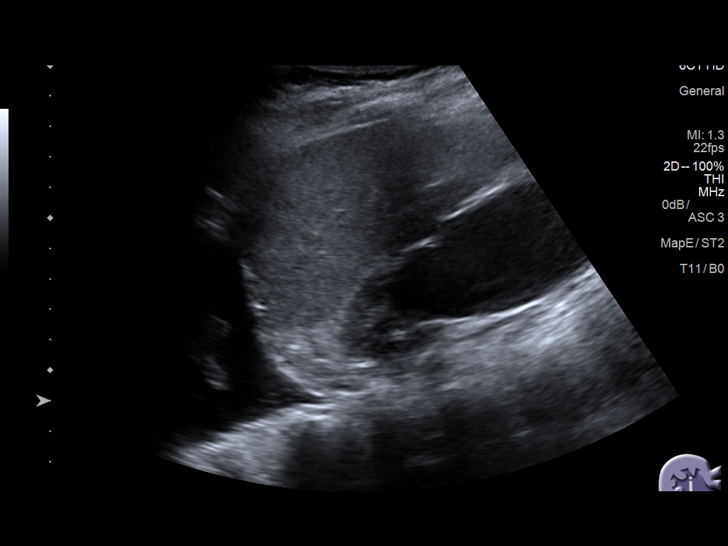
[im 3/34]
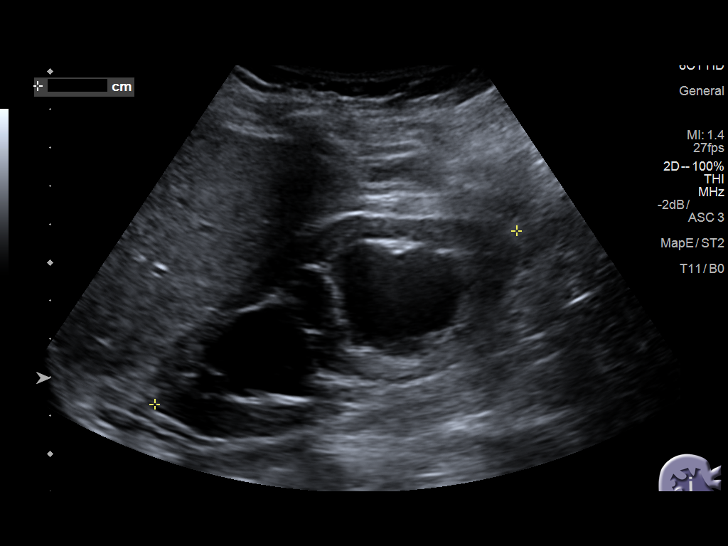
[im 6/34]
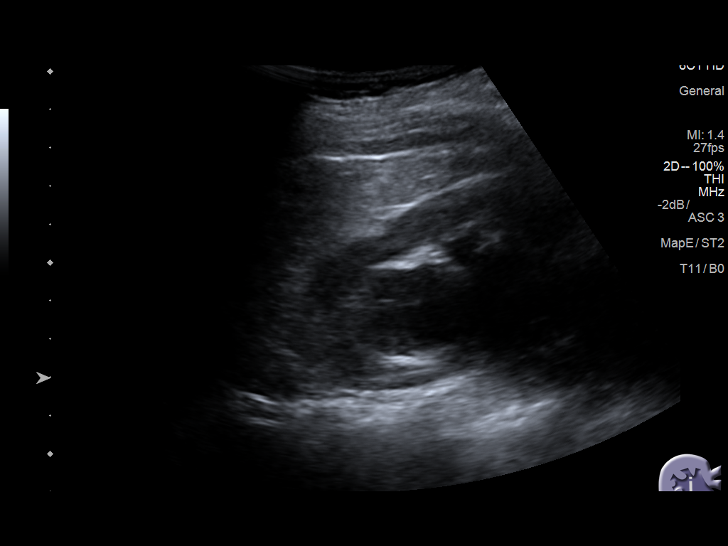
[im 9/34]
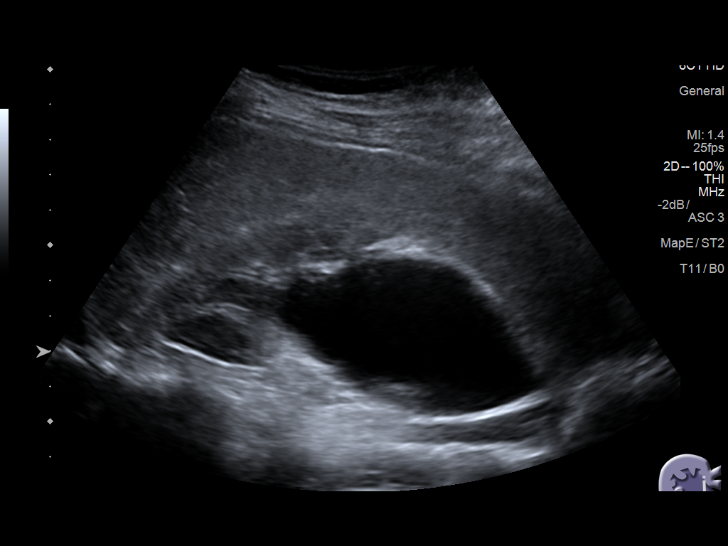
[im 12/34]
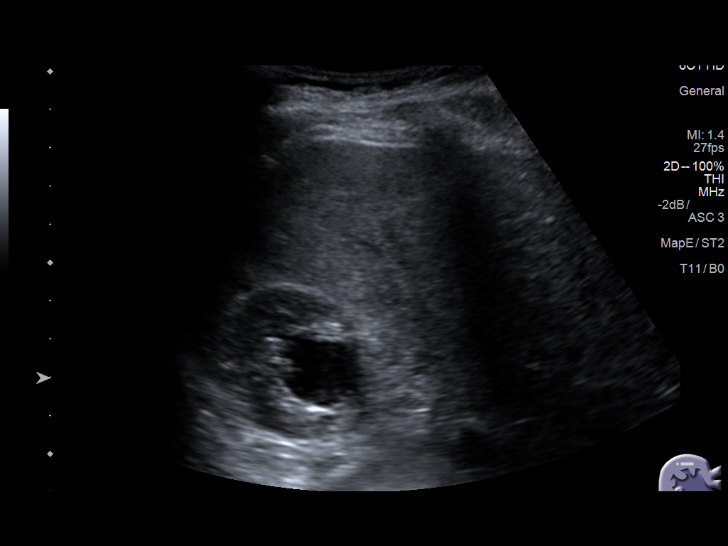
[im 13/34]
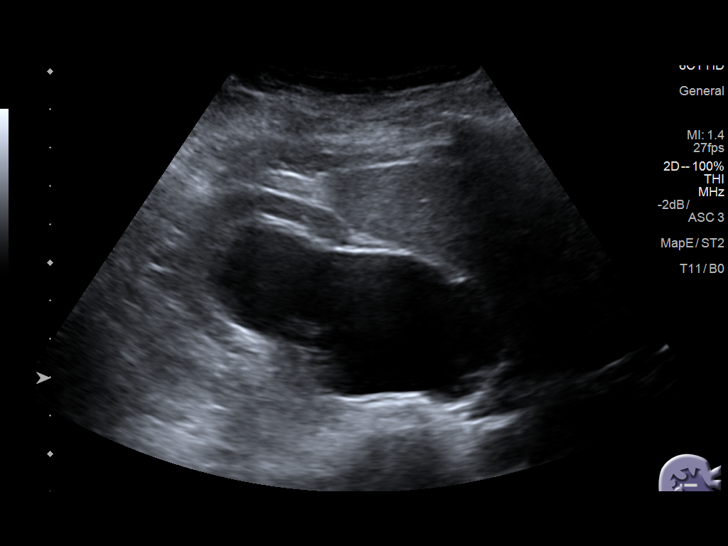
[im 16/34]
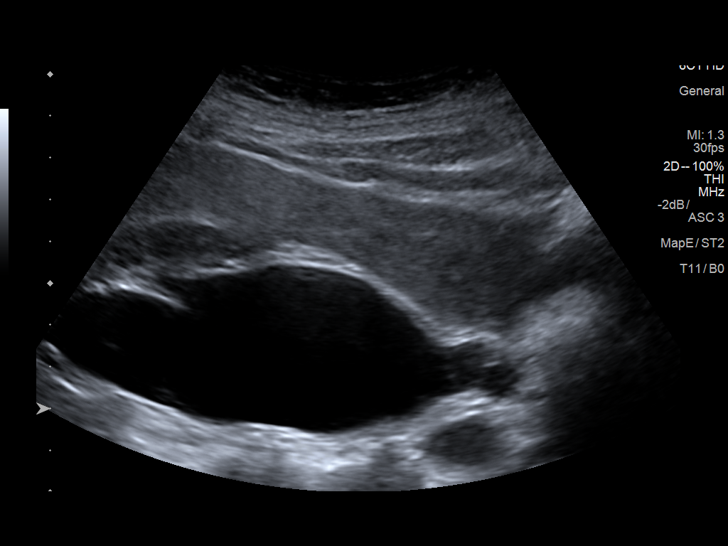
[im 18/34]
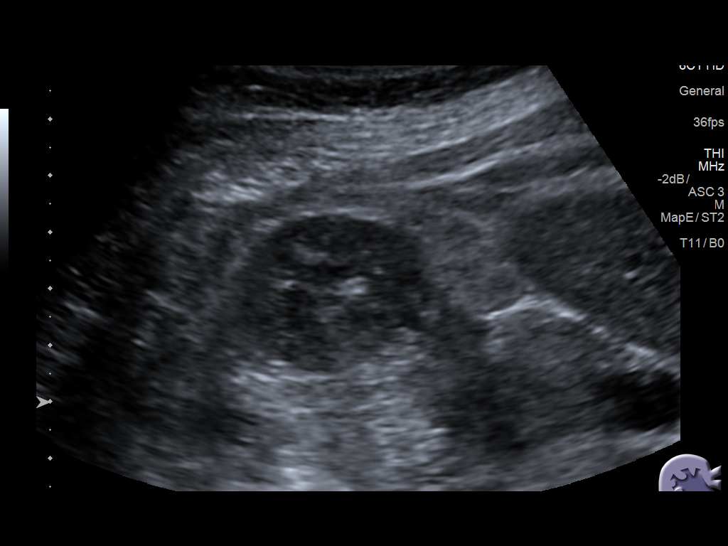
[im 21/34]
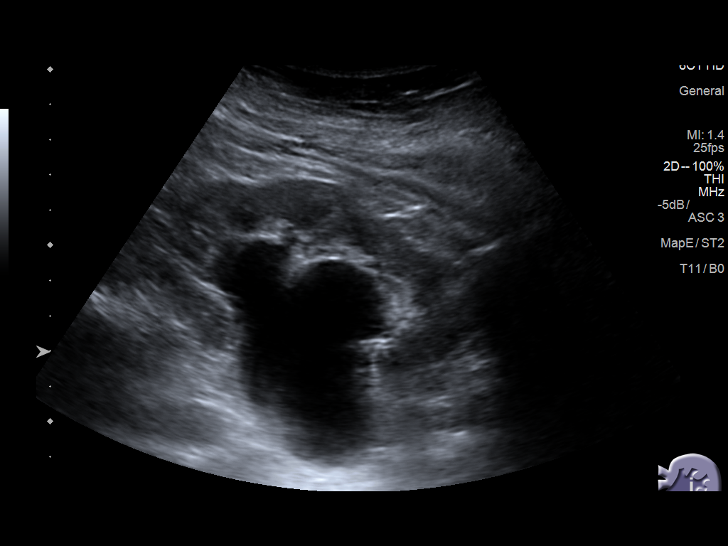
[im 23/34]
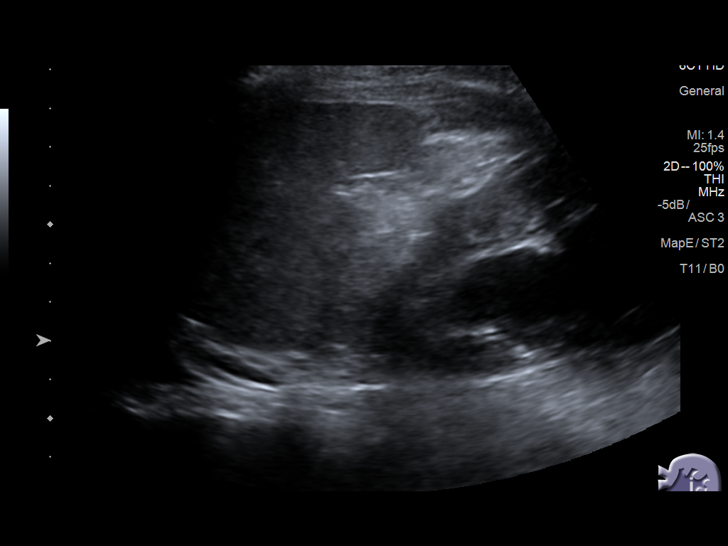
[im 25/34]
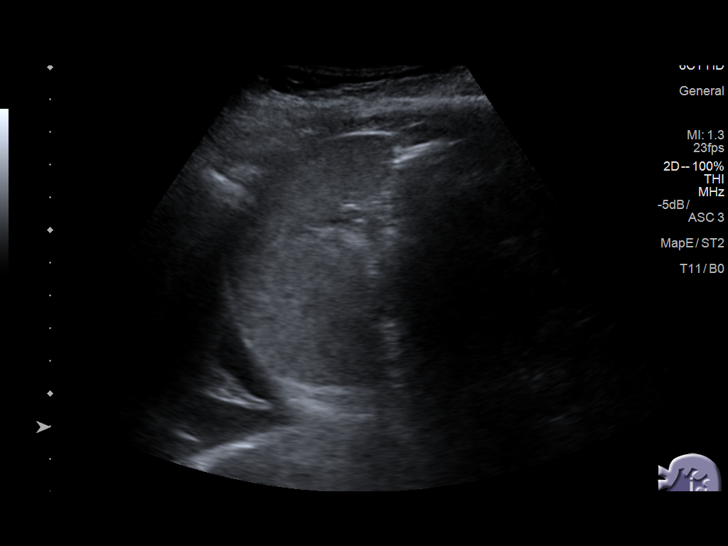
[im 28/34]
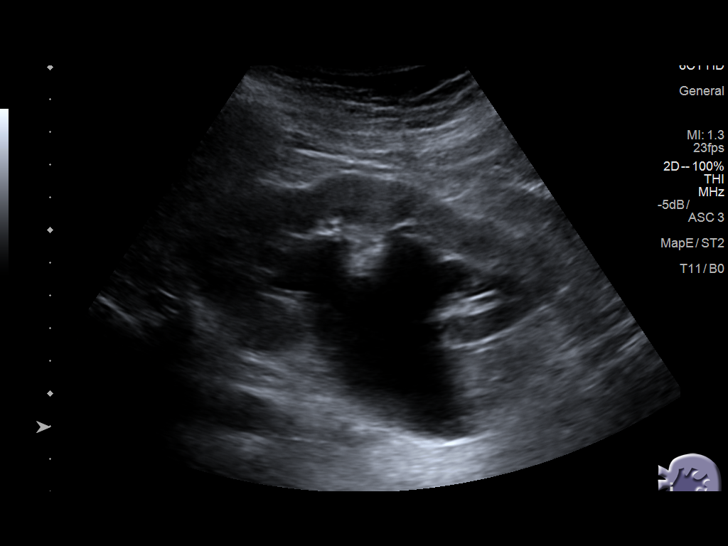
[im 31/34]
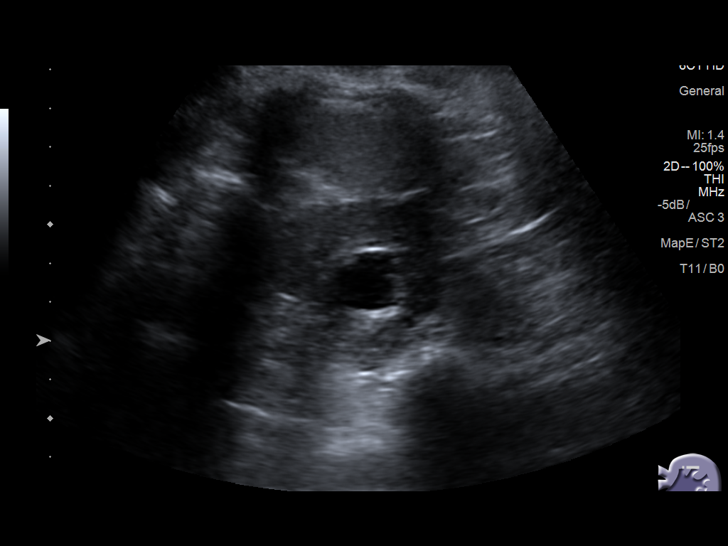
[im 34/34]
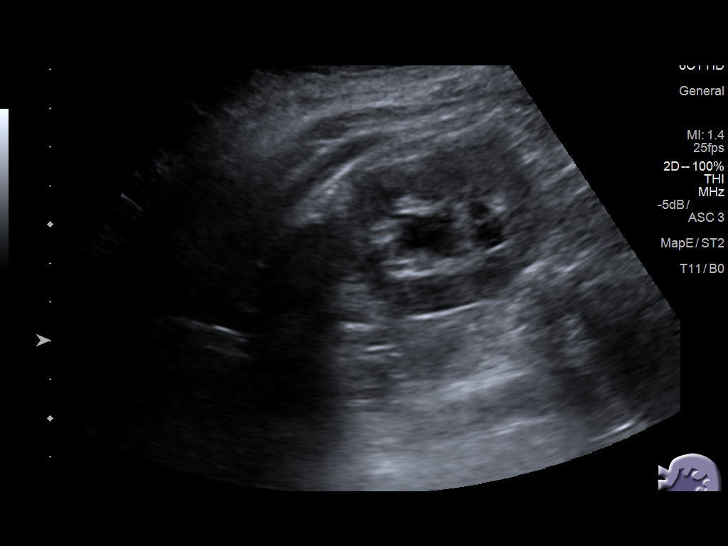

[14 of 25 positions shown; findings below may reference images not displayed]

FINDINGS: Right Kidney:

Length: 10.5 cm. Echogenicity within normal limits. No mass
visualized. There is persistent severe chronic right-sided
hydronephrosis.

Left Kidney:

Length: 12.2 cm. Echogenicity within normal limits. No mass
visualized. There is persistent severe chronic left-sided
hydronephrosis.

Bladder:

Not seen on this study. The known Foley catheter is not well
characterized.

Small bilateral pleural effusions are seen.
IMPRESSION: 1. Persistent severe chronic bilateral hydronephrosis noted. This is
unchanged from prior studies.
2. Small bilateral pleural effusions noted.

## 2016-04-14 DIAGNOSIS — N1330 Unspecified hydronephrosis: Secondary | ICD-10-CM | POA: Diagnosis present

## 2016-04-14 DIAGNOSIS — R339 Retention of urine, unspecified: Secondary | ICD-10-CM | POA: Insufficient documentation

## 2016-04-14 DIAGNOSIS — N6011 Diffuse cystic mastopathy of right breast: Secondary | ICD-10-CM

## 2016-04-14 DIAGNOSIS — N139 Obstructive and reflux uropathy, unspecified: Secondary | ICD-10-CM | POA: Diagnosis present

## 2016-04-14 DIAGNOSIS — N179 Acute kidney failure, unspecified: Secondary | ICD-10-CM | POA: Diagnosis present

## 2016-04-14 DIAGNOSIS — I5032 Chronic diastolic (congestive) heart failure: Secondary | ICD-10-CM

## 2016-04-14 HISTORY — DX: Chronic diastolic (congestive) heart failure: I50.32

## 2016-04-14 HISTORY — DX: Diffuse cystic mastopathy of right breast: N60.11

## 2016-04-14 HISTORY — DX: Unspecified hydronephrosis: N13.30

## 2016-04-14 HISTORY — DX: Acute kidney failure, unspecified: N17.9

## 2016-04-14 HISTORY — DX: Retention of urine, unspecified: R33.9

## 2016-04-14 HISTORY — DX: Obstructive and reflux uropathy, unspecified: N13.9

## 2016-05-28 ENCOUNTER — Other Ambulatory Visit: Payer: Self-pay | Admitting: Interventional Cardiology

## 2016-11-21 IMAGING — CR DG CHEST 1V PORT
1 series · 1 of 1 positions shown · non-contrast
Comparison: 02/22/2015

CLINICAL DATA: Short of breath

EXAM:
PORTABLE CHEST 1 VIEW

[AP]
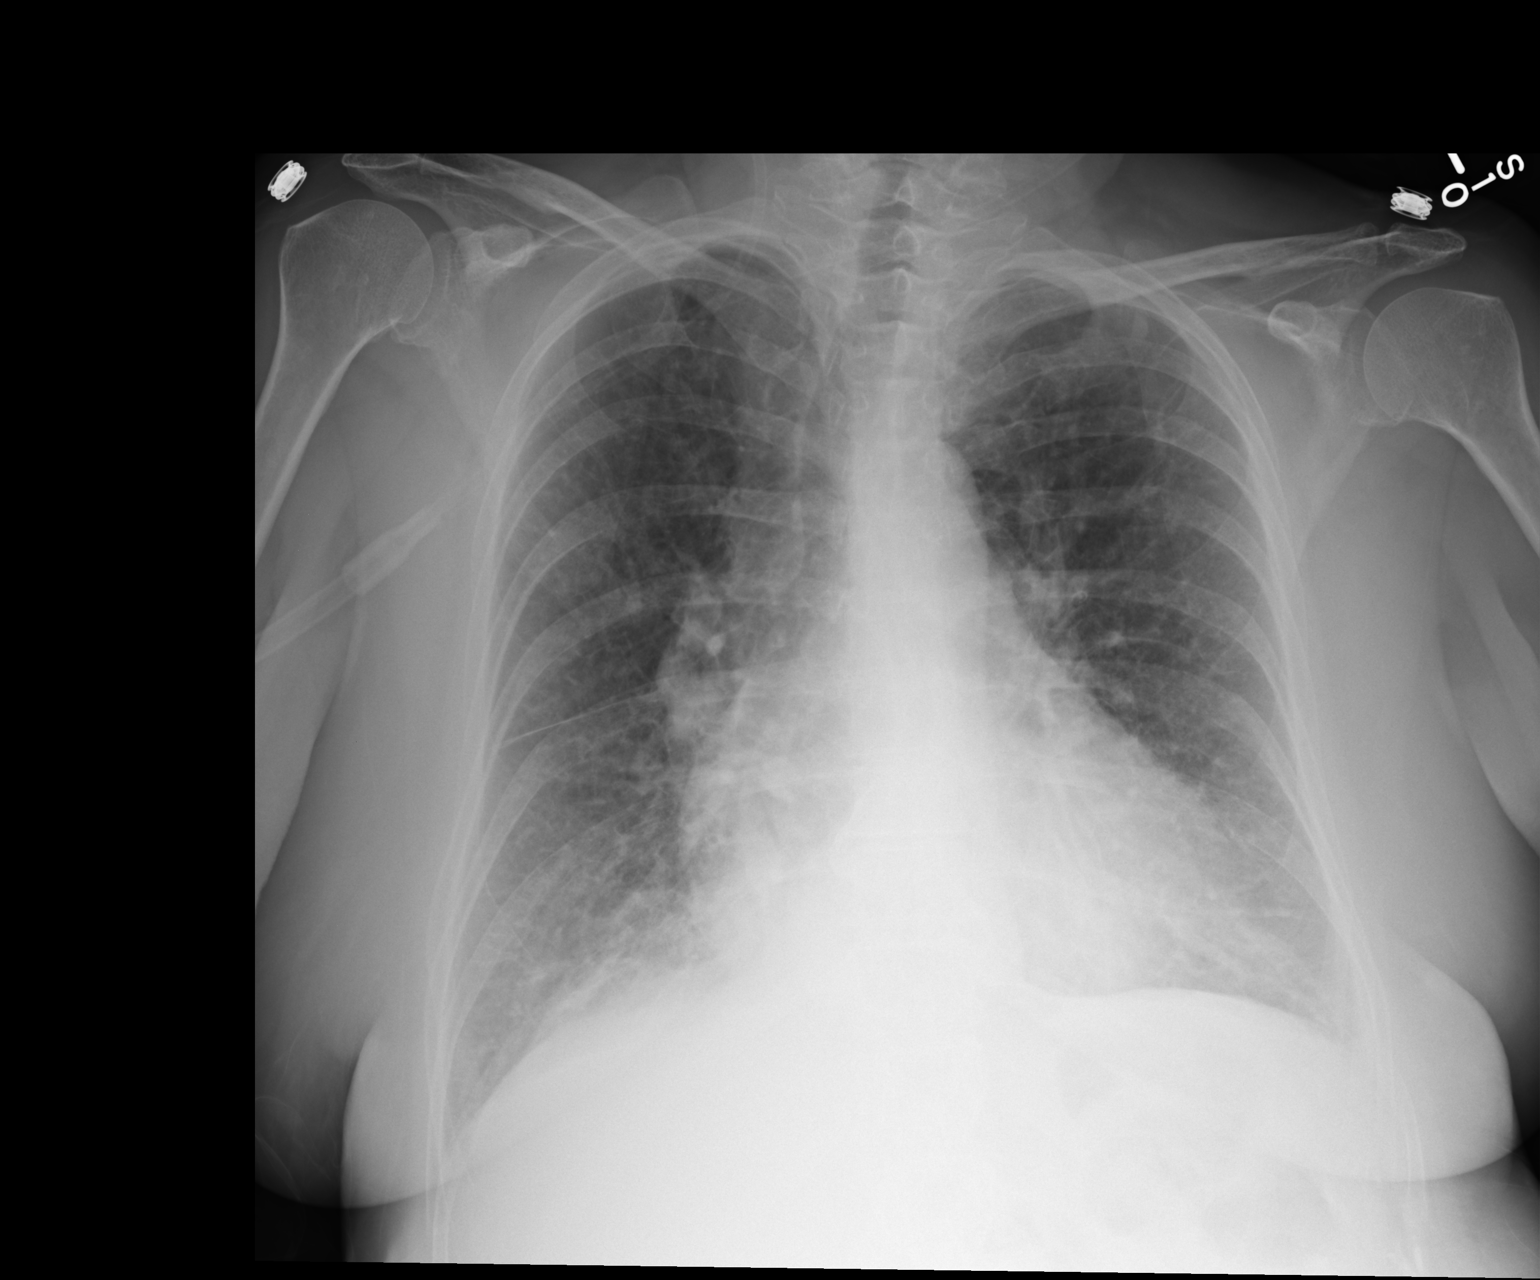

[1 of 1 positions shown; findings below may reference images not displayed]

FINDINGS: Cardiac enlargement. Progressive vascular congestion and mild edema.
Minimal left effusion. Mild bibasilar atelectasis.
IMPRESSION: Congestive heart failure with interstitial edema. Mild progression
from yesterday.

## 2016-11-23 IMAGING — DX DG CHEST 2V
2 series · 2 of 2 positions shown · non-contrast
Comparison: February 22, 2015 and February 23, 2015

CLINICAL DATA: Shortness of Breath

EXAM:
CHEST  2 VIEW

[chest lat]
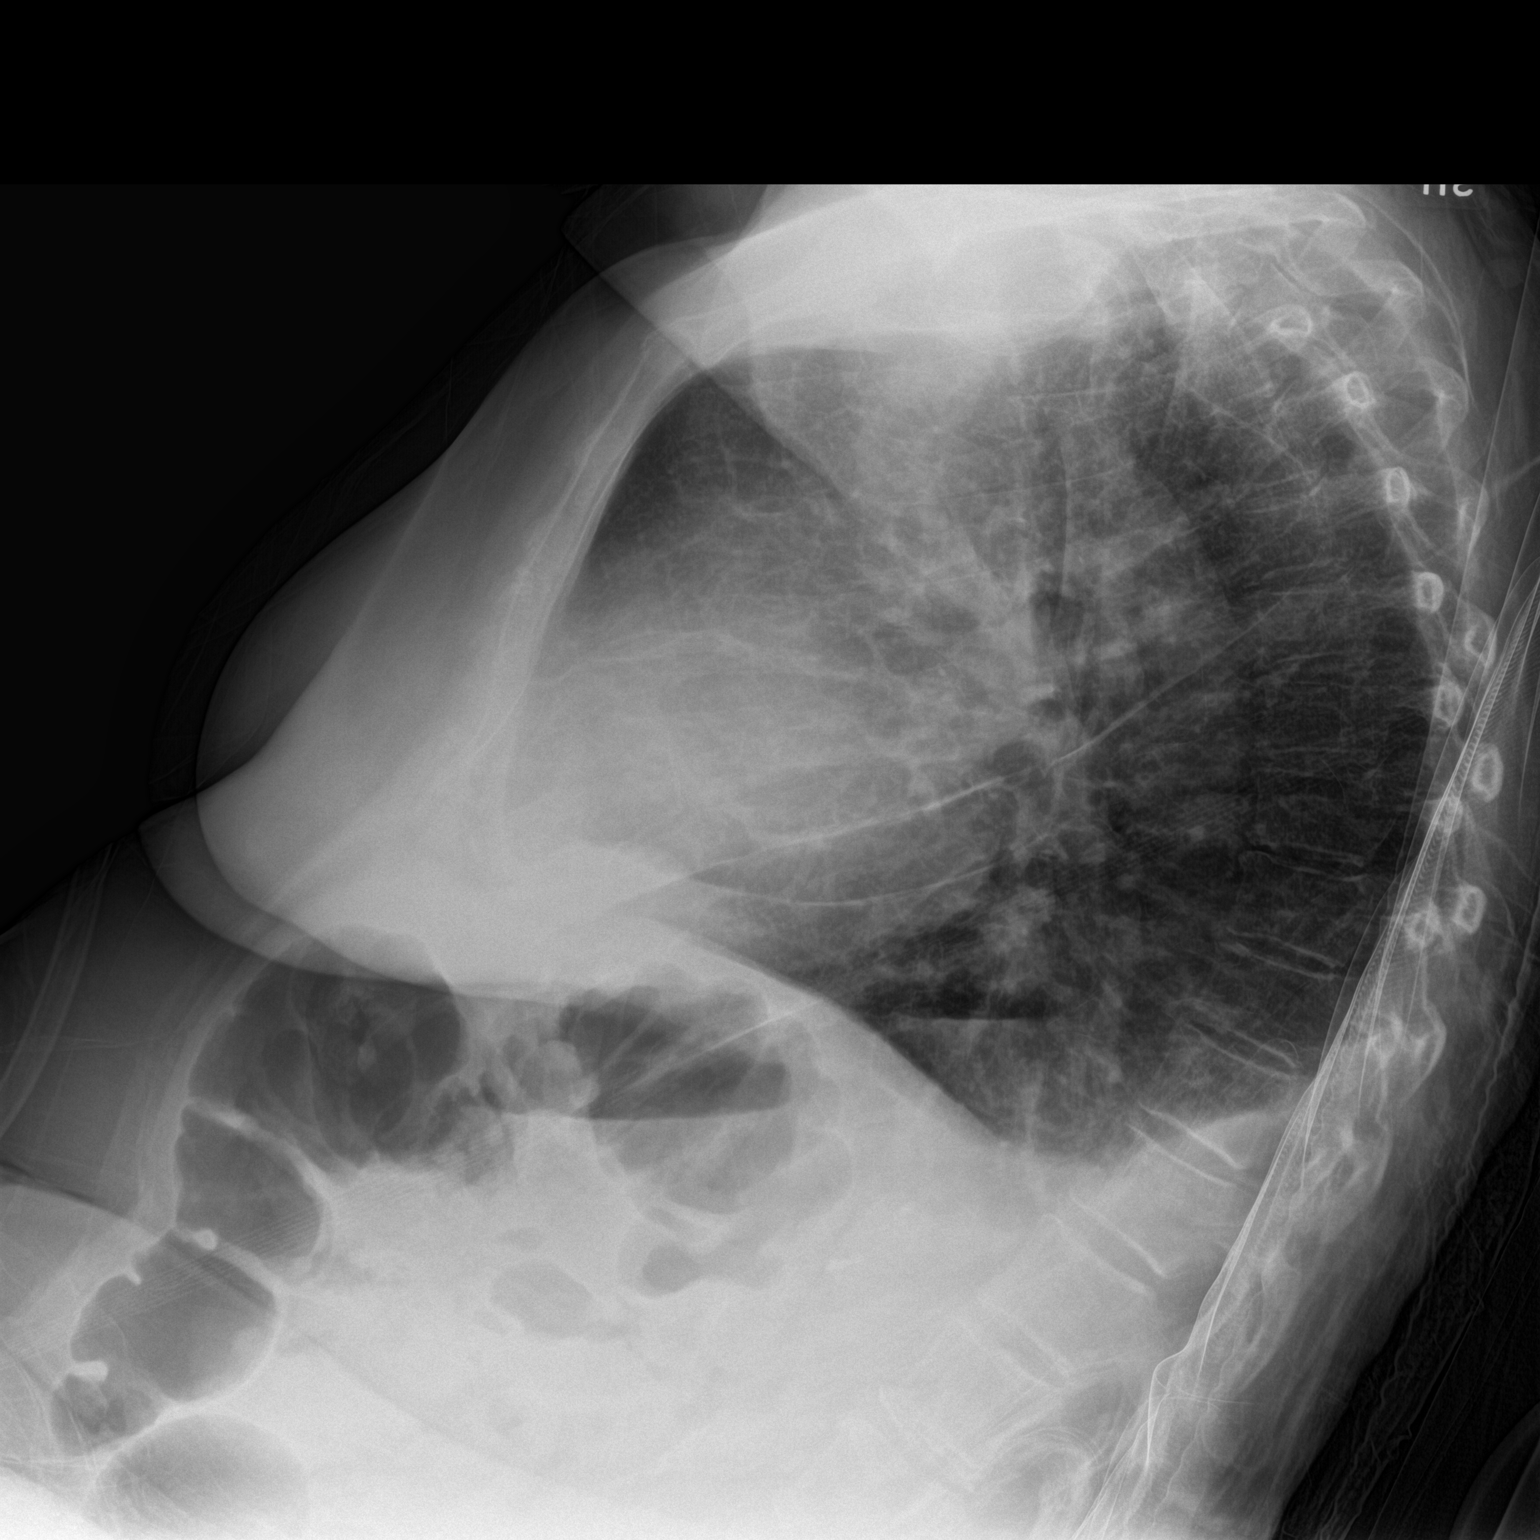

[chest ap]
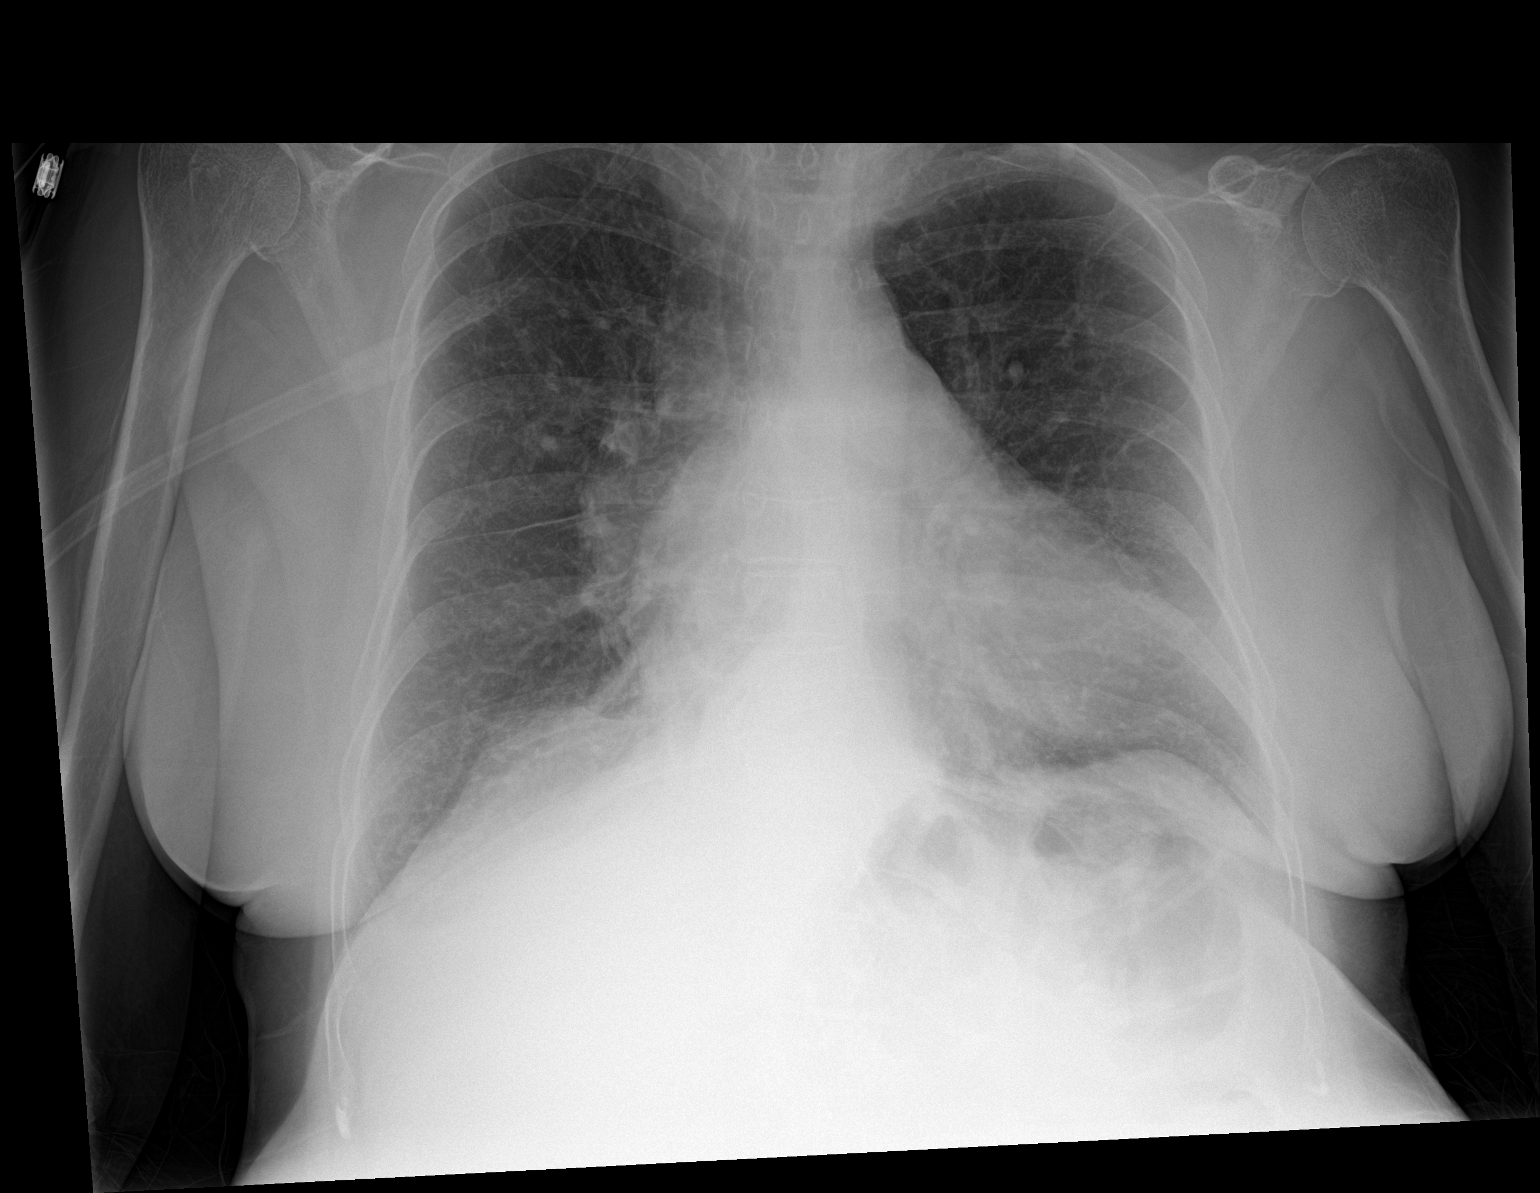

[2 of 2 positions shown; findings below may reference images not displayed]

FINDINGS: Lungs are mildly hyperexpanded. There has been interval resolution
of interstitial pulmonary edema. There is a small left pleural
effusion with mild left base atelectasis. Lungs elsewhere clear.
Heart is upper normal in size with pulmonary vascularity within
normal limits. No adenopathy. There is a small hiatal hernia. No
bone lesions.
IMPRESSION: Small left pleural effusion with mild left base atelectasis.
Interval resolution of interstitial edema. Small hiatal hernia.
Heart upper normal in size with pulmonary vascularity within normal
limits.

## 2018-07-19 DIAGNOSIS — E782 Mixed hyperlipidemia: Secondary | ICD-10-CM

## 2018-07-19 DIAGNOSIS — Z87891 Personal history of nicotine dependence: Secondary | ICD-10-CM

## 2018-07-19 HISTORY — DX: Personal history of nicotine dependence: Z87.891

## 2018-07-19 HISTORY — DX: Mixed hyperlipidemia: E78.2

## 2019-08-24 NOTE — Progress Notes (Signed)
Cardiology Office Note:    Date:  08/25/2019   ID:  Malachi Pro, DOB 1941/05/26, MRN [id number]  PCP:  Mariah Hickman, Mariah Phillips  Cardiologist:  Shirlee More, MD   Referring MD: Ailene Rud, NP  ASSESSMENT:    1. Hypertension, uncontrolled   2. SOB (shortness of breath) on exertion   3. Hyponatremia    PLAN:    In order of problems listed above:  1. Poorly controlled hypertension, to achieve target on asking the evenings to be a combination high potency ARB thiazide diuretic and add MRA of the lower limb stopping lisinopril and Dyazide.  She will return in 2 weeks and with a history of previous hyponatremia we will recheck renal function in with edema and shortness of breath and proBNP level.  If proBNP BNP is significantly elevated I will place her on a loop diuretic.  Do not think she needs repeat cardiac imaging or nuclear perfusion studies.  I did ask her to continue to check her blood pressure and gave her tips how to accurately do it told her to stop putting salt in her diet and stop smoking.  Check renal vascular duplex regarding renal artery stenosis or FMD.  If present would benefit from PCI 2. BNP level in 2 weeks 3. Fluid restrict 2 to 3 L/day presently she is fluid loading.  Next appointment weeks   Medication Adjustments/Labs and Tests Ordered: Current medicines are reviewed at length with the patient today.  Concerns regarding medicines are outlined above.  Orders Placed This Encounter  Procedures  . Basic Metabolic Panel (BMET)  . Pro b natriuretic peptide  . VAS US RENAL ARTERY DUPLEX   Meds ordered this encounter  Medications  . DISCONTD: telmisartan-hydrochlorothiazide (MICARDIS HCT) 40-12.5 MG tablet    Sig: Take 1 tablet by mouth daily. Take this medication at bedtime.    Dispense:  30 tablet    Refill:  3  . DISCONTD: eplerenone (INSPRA) 25 MG tablet    Sig: Take 1 tablet (25 mg total) by mouth daily.    Dispense:  30 tablet    Refill:  3  . eplerenone  (INSPRA) 25 MG tablet    Sig: Take 1 tablet (25 mg total) by mouth daily.    Dispense:  30 tablet    Refill:  3  . telmisartan-hydrochlorothiazide (MICARDIS HCT) 40-12.5 MG tablet    Sig: Take 1 tablet by mouth daily. Take this medication at bedtime.    Dispense:  30 tablet    Refill:  3     Chief Complaint  Patient presents with  . Hypertension    History of Present Illness:    Mariah Phillips is a 79 y.o. female who is being seen today for the evaluation and treatment of hypertension at the request of Ailene Rud, NP.  She has been cared for by [facility] office in [place].  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  Myocardial perfusion study performed May 2020 I cannot see the report of this described in the chart as normal.  She requested to be seen by me today because of hypertension.  Onset was age 45 and until 4 or 5 years ago her blood pressure is easily controlled with lisinopril.  Since that time her hypertension is uncontrolled recently in the morning  she gets systolics as high as [id number] and generally she is above 130 unfortunately she is adding salt to her diet and fluid loading with hyponatremia.  Labs showed normal sodium potassium and renal function.  She has no underlying history of kidney disease.  She does notice intermittent edema and has exertional shortness of breath.  He had her echocardiogram and myocardial perfusion studies that have been done.  No family history of kidney disease and renal artery stenosis.  Past Medical History:  Diagnosis Date  . Anemia    no blood transfusions in past  . Cystocele, midline   . Edema    lower extremities  . Esophageal reflux   . Foley catheter in place    hx. urinary  retention 03-22-15 Foley cath remains in place.  . Hydronephrosis    bilateral  . Hyperlipidemia   . Hypertension   . Old myocardial infarction   . Uterine prolapse     Past Surgical History:  Procedure Laterality Date  . ANTERIOR AND POSTERIOR REPAIR N/A 03/27/2015   Procedure: ANTERIOR (CYSTOCELE)  REPAIR ;  Surgeon: Bjorn Loser, MD;  Location: [facility];  Service: Urology;  Laterality: N/A;  . CATARACT EXTRACTION  2000/2010  . CHOLECYSTECTOMY     '84-open  . CYSTOSCOPY WITH RETROGRADE URETHROGRAM Bilateral 03/27/2015   Procedure: CYSTOSCOPY  ;  Surgeon: Bjorn Loser, MD;  Location: [facility];  Service: Urology;  Laterality: Bilateral;  . EYE SURGERY     retina surgery 2012  . SALPINGOOPHORECTOMY Bilateral 03/27/2015   Procedure: SALPINGO OOPHORECTOMY;  Surgeon: Servando Salina, MD;  Location: [facility];  Service: Gynecology;  Laterality: Bilateral;  . TUBAL LIGATION    . UPPER GASTROINTESTINAL ENDOSCOPY     with esophageal dilation  . VAGINAL HYSTERECTOMY N/A 03/27/2015   Procedure: HYSTERECTOMY VAGINAL;  Surgeon: Servando Salina, MD;  Location: [facility];  Service: Gynecology;  Laterality: N/A;  . VAGINAL PROLAPSE REPAIR N/A 03/27/2015   Procedure: VAGINAL VAULT SUSPENSION AND GRAFT;  Surgeon: Bjorn Loser, MD;  Location: [facility];  Service: Urology;  Laterality: N/A;    Current Medications: Current Meds  Medication Sig  . cloNIDine (CATAPRES) 0.2 MG tablet TAKE ONE TABLET BY MOUTH TWICE DAILY  . ergocalciferol (VITAMIN D2) 1.25 MG (50000 UT) capsule Take 50,000 Units by mouth once a week.  . fesoterodine (TOVIAZ) 8 MG TB24 tablet Take 8 mg by mouth daily.  . hydrALAZINE (APRESOLINE) 50 MG tablet Take 1 tablet (50 mg total) by mouth every 8 (eight) hours.  [name] omeprazole (PRILOSEC) 20 MG capsule Take 1 capsule by mouth every morning.   . pravastatin (PRAVACHOL) 20 MG tablet Take 20 mg by mouth every evening.   . [DISCONTINUED] lisinopril (ZESTRIL) 20 MG tablet Take 20 mg by  mouth daily.  . [DISCONTINUED] triamterene-hydrochlorothiazide (DYAZIDE) 37.5-25 MG capsule Take 1 capsule by mouth daily.     Allergies:   Amlodipine   Social History   Socioeconomic History  . Marital status: Widowed    Spouse name: Not on file  . Number of children: Not on file  . Years of education: Not on file  . Highest education level: Not on file  Occupational History  . Not on file  Tobacco Use  . Smoking status: Former Smoker    Quit date: 03/22/1983    Years since quitting: 36.4  . Smokeless tobacco: Never Used  . Tobacco comment: Quit 1984  Substance and Sexual Activity  . Alcohol use: No    Alcohol/week: 0.0  standard drinks  . Drug use: No  . Sexual activity: Not on file  Other Topics Concern  . Not on file  Social History Narrative  . Not on file   Social Determinants of Health   Financial Resource Strain:   . Difficulty of Paying Living Expenses:   Food Insecurity:   . Worried About Charity fundraiser in the Last Year:   . Arboriculturist in the Last Year:   Transportation Needs:   . Film/video editor (Medical):   [name] Lack of Transportation (Non-Medical):   Physical Activity:   . Days of Exercise per Week:   . Minutes of Exercise per Session:   Stress:   . Feeling of Stress :   Social Connections:   . Frequency of Communication with Friends and Family:   . Frequency of Social Gatherings with Friends and Family:   . Attends Religious Services:   . Active Member of Clubs or Organizations:   . Attends Archivist Meetings:   [name] Marital Status:      Family History: The patient's family history includes Heart Problems in her father; Heart attack in her father; Hypertension in her sister; Stroke in her maternal grandmother; Thyroid cancer in her mother.  ROS:   Review of Systems  Constitution: Negative.  HENT: Negative.   Eyes: Negative.   Cardiovascular: Positive for dyspnea on exertion and leg swelling.  Respiratory: Positive for  shortness of breath.   Endocrine: Negative.   Hematologic/Lymphatic: Negative.   Skin: Negative.   Musculoskeletal: Negative.   Gastrointestinal: Negative.   Genitourinary: Positive for bladder incontinence and frequency.  Neurological: Negative.   Psychiatric/Behavioral: Negative.    Please see the history of present illness.     All other systems reviewed and are negative.  EKGs/Labs/Other Studies Reviewed:    The following studies were reviewed today:  Recent Labs: No results found for requested labs within last 8760 hours.  Recent Lipid Panel    Component Value Date/Time   CHOL 104 03/29/2015 0435   TRIG 91 03/29/2015 0435   HDL 44 03/29/2015 0435   CHOLHDL 2.4 03/29/2015 0435   VLDL 18 03/29/2015 0435   LDLCALC 42 03/29/2015 0435    Physical Exam:    VS:  BP (!) 144/68   Pulse 62   Ht 4\' 7"  (1.397 m)   Wt 118 lb 12.8 oz (53.9 kg)   SpO2 99%   BMI 27.61 kg/m     Wt Readings from Last 3 Encounters:  08/25/19 118 lb 12.8 oz (53.9 kg)  04/26/15 110 lb (49.9 kg)  03/27/15 122 lb 5.7 oz (55.5 kg)     GEN:  Well nourished, well developed in no acute distress HEENT: Normal NECK: No JVD; No carotid bruits LYMPHATICS: No lymphadenopathy CARDIAC: RRR, no murmurs, rubs, gallops RESPIRATORY:  Clear to auscultation without rales, wheezing or rhonchi  ABDOMEN: Soft, non-tender, non-distended MUSCULOSKELETAL: 1-2+ ankle to knee bilateral pitting edema; No deformity  SKIN: Warm and dry NEUROLOGIC:  Alert and oriented x 3 PSYCHIATRIC:  Normal affect     Signed, Shirlee More, MD  08/25/2019 11:53 AM    [place]

## 2019-08-25 ENCOUNTER — Other Ambulatory Visit: Payer: Self-pay

## 2019-08-25 ENCOUNTER — Telehealth: Payer: Self-pay | Admitting: Cardiology

## 2019-08-25 ENCOUNTER — Ambulatory Visit: Payer: Medicare Other | Admitting: Cardiology

## 2019-08-25 ENCOUNTER — Encounter: Payer: Self-pay | Admitting: Cardiology

## 2019-08-25 ENCOUNTER — Telehealth: Payer: Self-pay

## 2019-08-25 VITALS — BP 144/68 | HR 62 | Ht <= 58 in | Wt 118.8 lb

## 2019-08-25 DIAGNOSIS — I10 Essential (primary) hypertension: Secondary | ICD-10-CM

## 2019-08-25 DIAGNOSIS — R0602 Shortness of breath: Secondary | ICD-10-CM | POA: Diagnosis not present

## 2019-08-25 DIAGNOSIS — E871 Hypo-osmolality and hyponatremia: Secondary | ICD-10-CM | POA: Diagnosis not present

## 2019-08-25 MED ORDER — TELMISARTAN-HCTZ 40-12.5 MG PO TABS: 1.0000 | ORAL_TABLET | Freq: Every day | ORAL | 3 refills | Status: DC

## 2019-08-25 MED ORDER — EPLERENONE 25 MG PO TABS: 25.0000 mg | ORAL_TABLET | Freq: Every day | ORAL | 3 refills | Status: DC

## 2019-08-25 NOTE — Telephone Encounter (Signed)
Follow Up:   Pt said she was told by [facility] that her medicine called in today could not be filled until June.. They said it was called in to a mail order pharmacy. The medicine needs to be at [facility] please. Pt says she needs her medicine asap please.

## 2019-08-25 NOTE — Telephone Encounter (Signed)
Spoke to patient and let her know that I have called in her prescriptions to the [place].

## 2019-08-25 NOTE — Patient Instructions (Signed)
Medication Instructions:  Your physician has recommended you make the following change in your medication:  STOP Dyazide STOP Lisinopril START Inspra 25mg  take one tablet by mouth daily. START Telmisartan-HCTZ 40-12.5 mg take one tablet by mouth daily at bedtime. *If you need a refill on your cardiac medications before your next appointment, please call your pharmacy*   Lab Work: Your physician recommends that you return for lab work in: 2 weeks BMP, Pro BNP If you have labs (blood work) drawn today and your tests are completely normal, you will receive your results only by: [name] MyChart Message (if you have MyChart) OR . A paper copy in the mail If you have any lab test that is abnormal or we need to change your treatment, we will call you to review the results.   Testing/Procedures: Your physician has requested that you have a renal artery duplex. During this test, an ultrasound is used to evaluate blood flow to the kidneys. Allow one hour for this exam. Do not eat after midnight the day before and avoid carbonated beverages. Take your medications as you usually do.    Follow-Up: At [facility], you and your health needs are our priority.  As part of our continuing mission to provide you with exceptional heart care, we have created designated Provider Care Teams.  These Care Teams include your primary Cardiologist (physician) and Advanced Practice Providers (APPs -  Physician Assistants and Nurse Practitioners) who all work together to provide you with the care you need, when you need it.  We recommend signing up for the patient portal called "MyChart".  Sign up information is provided on this After Visit Summary.  MyChart is used to connect with patients for Virtual Visits (Telemedicine).  Patients are able to view lab/test results, encounter notes, upcoming appointments, etc.  Non-urgent messages can be sent to your provider as well.   To learn more about what you can do with MyChart,  go to [email or web address].    Your next appointment:   4 week(s)  The format for your next appointment:   In Person  Provider:   Shirlee More, MD   Other Instructions Do not add salt to your diet and try to limit your salt intake as much as possible  Limit your fluid intake to 2-3 liters per day.  Please keep a log of your daily blood pressures and bring them in for [place] to review when you come back for lab work in two weeks.

## 2019-08-25 NOTE — Telephone Encounter (Signed)
Phone is busy

## 2019-08-25 NOTE — Telephone Encounter (Signed)
New message   Patient has a question about her medication list . Please call.

## 2019-08-25 NOTE — Telephone Encounter (Signed)
I spoke with patient. She has a question regarding dyazide.  I told her this was also called triamterene-HCTZ. She is aware to stop this and also stop Lisinopril.  Pt reports she is not taking fesoterodine. Will remove from her list.

## 2019-09-02 ENCOUNTER — Telehealth: Payer: Self-pay | Admitting: Cardiology

## 2019-09-02 ENCOUNTER — Encounter: Payer: Self-pay | Admitting: Cardiology

## 2019-09-02 NOTE — Telephone Encounter (Signed)
Spoke with patient and advised her to call [facility] to discuss this issue with them further. She states that she had spoke with the [facility] in regards to these medications and discussed a "waiver" with them in order to get it at [place] instead of through the mail order pharmacy.    Encouraged patient to call back with any questions or concerns.

## 2019-09-02 NOTE — Telephone Encounter (Signed)
New Message    Pt c/o medication issue:  1. Name of Medication: eplerenone (INSPRA) 25 MG tablet  2. How are you currently taking this medication (dosage and times per day)? Taking as prescribed   3. Are you having a reaction (difficulty breathing--STAT)? No   4. What is your medication issue? Pt says she got the medication from [place] and receive the medication from [place] and has a bill now for the medication    Please advise

## 2019-09-09 ENCOUNTER — Telehealth: Payer: Self-pay

## 2019-09-09 DIAGNOSIS — I5032 Chronic diastolic (congestive) heart failure: Secondary | ICD-10-CM

## 2019-09-09 DIAGNOSIS — I10 Essential (primary) hypertension: Secondary | ICD-10-CM

## 2019-09-09 LAB — BASIC METABOLIC PANEL
BUN/Creatinine Ratio: 13 (ref 12–28)
BUN: 13 mg/dL (ref 8–27)
CO2: 21 mmol/L (ref 20–29)
Calcium: 9.6 mg/dL (ref 8.7–10.3)
Chloride: 90 mmol/L — ABNORMAL LOW (ref 96–106)
Creatinine, Ser: 0.98 mg/dL (ref 0.57–1.00)
GFR calc Af Amer: 64 mL/min/{1.73_m2} (ref >59)
GFR calc non Af Amer: 56 mL/min/{1.73_m2} — ABNORMAL LOW (ref >59)
Glucose: 111 mg/dL — ABNORMAL HIGH (ref 65–99)
Potassium: 5 mmol/L (ref 3.5–5.2)
Sodium: 124 mmol/L — ABNORMAL LOW (ref 134–144)

## 2019-09-09 LAB — PRO B NATRIURETIC PEPTIDE: NT-Pro BNP: 170 pg/mL (ref 0–738)

## 2019-09-09 NOTE — Telephone Encounter (Signed)
-----   Message from Richardo Priest, MD sent at 09/09/2019  8:14 AM EDT ----- Normal or stable result  Discontinue Inspra please send a copy to her primary care physician and needs to have another BMP done on Monday if having symptoms predominantly weakness fatigue loss of balance I would present to the emergency room for inpatient treatment.  Please be sure that she is fluid restricting 2 L/day

## 2019-09-09 NOTE — Telephone Encounter (Signed)
Spoke with patient regarding results and recommendations.  1. Discontinue Inspra.  2. Come in on Monday to get BMP drawn.  3. Restrict fluids to 2L/day 4. Present to the emergency room if having any worsening symptoms.   Patient verbalizes understanding and is agreeable to plan of care. Advised patient to call back with any issues or concerns.

## 2019-09-10 ENCOUNTER — Telehealth: Payer: Self-pay | Admitting: Cardiology

## 2019-09-10 NOTE — Telephone Encounter (Signed)
Patients granddaughter called stating that her grandmother just started a new BP medicine and she was called and was told to stop the Eplerenone due to hyponatremia and to come to get blood work on Monday.  She is now having elevated BPs up to 205/57mmHg today and has had a headache.  Instructed her to go to ER for evaluation and granddaughter agrees to take her.

## 2019-09-12 ENCOUNTER — Telehealth: Payer: Self-pay | Admitting: Cardiology

## 2019-09-12 NOTE — Telephone Encounter (Signed)
Spoke with patients granddaughter regarding Dr. Joya Gaskins recommendation.  She verbalizes understanding and is agreeable to plan of care. Advised patient/granddaughter to call back with any issues or concerns.

## 2019-09-12 NOTE — Telephone Encounter (Signed)
° °  Pt c/o medication issue:  1. Name of Medication: Inspra  2. How are you currently taking this medication (dosage and times per day)?   3. Are you having a reaction (difficulty breathing--STAT)?   4. What is your medication issue? Pt's granddaughter called, she said Dr. Joya Gaskins nurse called Friday and was advised to stop taking her inspra since pt's blood work came in normal. Friday night pt did not take inspra and saturday morning pt has bad headache, face is really red and BP is at 205/62. They call the on call line and spoke with Dr. Radford Pax and was advised to go to ED to check if there's any bleeding on her head. Since pt took 10 o'clock meds the BP went down and pt refused to go to the ED. Sunday morning pt's BP spike again at [id number] systolic and granddaughter decided to give pt inspra, then pt's BP has been regulated. She wanted to know if the pt still need to have blood work done today since she only off the pill one day. She also wanted to know if the pt needs to stop inspra what will be the alternate meds.  Please call

## 2019-09-12 NOTE — Telephone Encounter (Signed)
Continue Inspra

## 2019-09-13 ENCOUNTER — Telehealth: Payer: Self-pay

## 2019-09-13 DIAGNOSIS — I10 Essential (primary) hypertension: Secondary | ICD-10-CM

## 2019-09-13 DIAGNOSIS — I5032 Chronic diastolic (congestive) heart failure: Secondary | ICD-10-CM

## 2019-09-13 LAB — BASIC METABOLIC PANEL
BUN/Creatinine Ratio: 20 (ref 12–28)
BUN: 20 mg/dL (ref 8–27)
CO2: 23 mmol/L (ref 20–29)
Calcium: 9.5 mg/dL (ref 8.7–10.3)
Chloride: 89 mmol/L — ABNORMAL LOW (ref 96–106)
Creatinine, Ser: 1 mg/dL (ref 0.57–1.00)
GFR calc Af Amer: 63 mL/min/{1.73_m2} (ref >59)
GFR calc non Af Amer: 54 mL/min/{1.73_m2} — ABNORMAL LOW (ref >59)
Glucose: 98 mg/dL (ref 65–99)
Potassium: 5.4 mmol/L — ABNORMAL HIGH (ref 3.5–5.2)
Sodium: 126 mmol/L — ABNORMAL LOW (ref 134–144)

## 2019-09-13 NOTE — Telephone Encounter (Signed)
Spoke with patient regarding results and recommendation.  Patient verbalizes understanding and is agreeable to plan of care. Advised patient to call back with any issues or concerns.  

## 2019-09-13 NOTE — Telephone Encounter (Signed)
-----   Message from Richardo Priest, MD sent at 09/13/2019 12:11 PM EDT ----- Normal or stable result  Keep her fluid restriction at 2 L let us recheck again in 1 week

## 2019-09-20 ENCOUNTER — Telehealth: Payer: Self-pay

## 2019-09-20 DIAGNOSIS — I5032 Chronic diastolic (congestive) heart failure: Secondary | ICD-10-CM

## 2019-09-20 LAB — BASIC METABOLIC PANEL
BUN/Creatinine Ratio: 29 — ABNORMAL HIGH (ref 12–28)
BUN: 33 mg/dL — ABNORMAL HIGH (ref 8–27)
CO2: 23 mmol/L (ref 20–29)
Calcium: 9.7 mg/dL (ref 8.7–10.3)
Chloride: 94 mmol/L — ABNORMAL LOW (ref 96–106)
Creatinine, Ser: 1.12 mg/dL — ABNORMAL HIGH (ref 0.57–1.00)
GFR calc Af Amer: 55 mL/min/{1.73_m2} — ABNORMAL LOW (ref >59)
GFR calc non Af Amer: 47 mL/min/{1.73_m2} — ABNORMAL LOW (ref >59)
Glucose: 107 mg/dL — ABNORMAL HIGH (ref 65–99)
Potassium: 5.2 mmol/L (ref 3.5–5.2)
Sodium: 132 mmol/L — ABNORMAL LOW (ref 134–144)

## 2019-09-20 NOTE — Telephone Encounter (Signed)
-----   Message from Richardo Priest, MD sent at 09/20/2019  4:51 PM EDT ----- Normal or stable result   Continue the same in about 1 month we should recheck a BMP really like her sodium and continue fluid restriction

## 2019-09-20 NOTE — Telephone Encounter (Signed)
Left message on patients voicemail to please return our call.   

## 2019-09-21 NOTE — Telephone Encounter (Signed)
Follow up  ° ° °Pt is returning call  ° ° °Please call back  °

## 2019-09-21 NOTE — Telephone Encounter (Signed)
Spoke with patient regarding results and recommendation.  Patient verbalizes understanding and is agreeable to plan of care. Advised patient to call back with any issues or concerns.  

## 2019-09-21 NOTE — Progress Notes (Signed)
Cardiology Office Note:    Date:  09/22/2019   ID:  [name], DOB [birth year], MRN [id number]  PCP:  Madison Hickman, FNP  Cardiologist:  Shirlee More, MD    Referring MD: Madison Hickman, FNP    ASSESSMENT:    1. Resistant hypertension   2. Hyponatremia    PLAN:    In order of problems listed above:  1. Marked improvement I think at this point time she is in a good range continue current regimen including addition of MRA and as needed clonidine for paroxysm and has directed to go ahead and get her renal vascular duplex.  We have arrangements for another BMP as an outpatient we will plan to see in the office in 3 months 2. Dilutional improved continue fluid restriction   Next appointment: 3 months   Medication Adjustments/Labs and Tests Ordered: Current medicines are reviewed at length with the patient today.  Concerns regarding medicines are outlined above.  No orders of the defined types were placed in this encounter.  No orders of the defined types were placed in this encounter.   Chief Complaint  Patient presents with  . Follow-up  . Hypertension    History of Present Illness:    Mariah Phillips is a 79 y.o. female with a hx of resistant hypertension hyponatremia and shortness of breath last seen 08/25/1998.  proBNP level is quite low at 170, and the fluid restriction.  Serum sodium is improved from 124-132. Compliance with diet, lifestyle and medications: Yes  She has improved his fluid restricting serum sodium is close to normal and blood pressure generally is at the desired range at this point time [id number] mmHg systolic in the interim she has had 2 paroxysms headache flushing systolic greater to moderate and took an extra dose of clonidine arrested with quick relief.  She still is due to have a renal vascular duplex.  I do think that her distal diuretic is helped I think her blood pressure is improved and I asked her to continue with fluid restriction and close  monitoring at home and at this point no alteration of her multidrug regimen.  If she has renal artery stenosis and difficult to control we could consider PCI and stent. Past Medical History:  Diagnosis Date  . Acute kidney injury ([place]) 04/14/2016  . Anemia    no blood transfusions in past  . Asymptomatic hypertensive urgency 03/27/2015  . Bilateral hydronephrosis 04/14/2016  . Chronic diastolic CHF (congestive heart failure), NYHA class 2 ([place]) 04/14/2016  . Cystocele 03/27/2015  . Cystocele, midline   . Edema    lower extremities  . Esophageal reflux   . Fibrocystic breast changes of both breasts 04/14/2016  . Foley catheter in place    hx. urinary retention 03-22-15 Foley cath remains in place.  . Former smoker 07/19/2018  . Hydronephrosis    bilateral  . Hyperlipidemia   . Hypertension   . Hypertension, uncontrolled 01/23/2015  . Malignant essential hypertension 01/23/2015  . Mixed hyperlipidemia 07/19/2018  . Normocytic anemia 02/26/2015  . Obstructive uropathy 04/14/2016  . Old myocardial infarction   . Urinary retention 04/14/2016   Formatting of this note might be different from the original. Last Assessment & Plan:  Foley is draining well today and the bladder spasms have improved with oxybutynin.    She has some pyuria and hematuria on the UA from last night but I had tried to reduce her prolapse while assessing foley placement and the could  be the cause.    Continue oxybutynin and foley drainage.  [name] Uterine prolapse     Past Surgical History:  Procedure Laterality Date  . ANTERIOR AND POSTERIOR REPAIR N/A 03/27/2015   Procedure: ANTERIOR (CYSTOCELE)  REPAIR ;  Surgeon: Bjorn Loser, MD;  Location: [facility];  Service: Urology;  Laterality: N/A;  . CATARACT EXTRACTION  2000/2010  . CHOLECYSTECTOMY     '84-open  . CYSTOSCOPY WITH RETROGRADE URETHROGRAM Bilateral 03/27/2015   Procedure: CYSTOSCOPY  ;  Surgeon: Bjorn Loser, MD;  Location: [facility];  Service: Urology;   Laterality: Bilateral;  . EYE SURGERY     retina surgery 2012  . SALPINGOOPHORECTOMY Bilateral 03/27/2015   Procedure: SALPINGO OOPHORECTOMY;  Surgeon: Servando Salina, MD;  Location: [facility];  Service: Gynecology;  Laterality: Bilateral;  . TUBAL LIGATION    . UPPER GASTROINTESTINAL ENDOSCOPY     with esophageal dilation  . VAGINAL HYSTERECTOMY N/A 03/27/2015   Procedure: HYSTERECTOMY VAGINAL;  Surgeon: Servando Salina, MD;  Location: [facility];  Service: Gynecology;  Laterality: N/A;  . VAGINAL PROLAPSE REPAIR N/A 03/27/2015   Procedure: VAGINAL VAULT SUSPENSION AND GRAFT;  Surgeon: Bjorn Loser, MD;  Location: [facility];  Service: Urology;  Laterality: N/A;    Current Medications: Current Meds  Medication Sig  . cloNIDine (CATAPRES) 0.2 MG tablet TAKE ONE TABLET BY MOUTH TWICE DAILY  . eplerenone (INSPRA) 25 MG tablet Take 25 mg by mouth daily.  . ergocalciferol (VITAMIN D2) 1.25 MG (50000 UT) capsule Take 50,000 Units by mouth once a week.  . hydrALAZINE (APRESOLINE) 50 MG tablet Take 1 tablet (50 mg total) by mouth every 8 (eight) hours.  [name] omeprazole (PRILOSEC) 20 MG capsule Take 1 capsule by mouth every morning.   . pravastatin (PRAVACHOL) 20 MG tablet Take 20 mg by mouth every evening.   [name] telmisartan-hydrochlorothiazide (MICARDIS HCT) 40-12.5 MG tablet Take 1 tablet by mouth daily. Take this medication at bedtime.     Allergies:   Amlodipine   Social History   Socioeconomic History  . Marital status: Widowed    Spouse name: Not on file  . Number of children: Not on file  . Years of education: Not on file  . Highest education level: Not on file  Occupational History  . Not on file  Tobacco Use  . Smoking status: Former Smoker    Quit date: 03/22/1983    Years since quitting: 36.5  . Smokeless tobacco: Never Used  . Tobacco comment: Quit 1984  Substance and Sexual Activity  . Alcohol use: No    Alcohol/week: 0.0 standard drinks  . Drug use: No  . Sexual  activity: Not on file  Other Topics Concern  . Not on file  Social History Narrative  . Not on file   Social Determinants of Health   Financial Resource Strain:   . Difficulty of Paying Living Expenses:   Food Insecurity:   . Worried About Charity fundraiser in the Last Year:   . Arboriculturist in the Last Year:   Transportation Needs:   . Film/video editor (Medical):   [name] Lack of Transportation (Non-Medical):   Physical Activity:   . Days of Exercise per Week:   . Minutes of Exercise per Session:   Stress:   . Feeling of Stress :   Social Connections:   . Frequency of Communication with Friends and Family:   . Frequency of Social Gatherings with Friends and Family:   .  Attends Religious Services:   . Active Member of Clubs or Organizations:   . Attends Archivist Meetings:   [name] Marital Status:      Family History: The patient's family history includes Heart Problems in her father; Heart attack in her father; Hypertension in her sister; Stroke in her maternal grandmother; Thyroid cancer in her mother. ROS:   Please see the history of present illness.    All other systems reviewed and are negative.  EKGs/Labs/Other Studies Reviewed:    The following studies were reviewed today:  EKG:  EKG ordered today and personally reviewed.  The ekg ordered today demonstrates sinus bradycardia otherwise normal  Recent Labs: 09/08/2019: NT-Pro BNP 170 09/20/2019: BUN 33; Creatinine, Ser 1.12; Potassium 5.2; Sodium 132  Recent Lipid Panel    Component Value Date/Time   CHOL 104 03/29/2015 0435   TRIG 91 03/29/2015 0435   HDL 44 03/29/2015 0435   CHOLHDL 2.4 03/29/2015 0435   VLDL 18 03/29/2015 0435   LDLCALC 42 03/29/2015 0435    Physical Exam:    VS:  BP 120/65 (BP Location: Left Arm, Patient Position: Sitting, Cuff Size: Normal)   Pulse 68   Temp 98.2 F (36.8 C)   Ht 4\' 7"  (1.397 m)   Wt 113 lb 12.8 oz (51.6 kg)   SpO2 98%   BMI 26.45 kg/m     Wt  Readings from Last 3 Encounters:  09/22/19 113 lb 12.8 oz (51.6 kg)  08/25/19 118 lb 12.8 oz (53.9 kg)  04/26/15 110 lb (49.9 kg)     GEN:  Well nourished, well developed in no acute distress HEENT: Normal NECK: No JVD; No carotid bruits LYMPHATICS: No lymphadenopathy CARDIAC: RRR, no murmurs, rubs, gallops RESPIRATORY:  Clear to auscultation without rales, wheezing or rhonchi  ABDOMEN: Soft, non-tender, non-distended MUSCULOSKELETAL:  No edema; No deformity  SKIN: Warm and dry NEUROLOGIC:  Alert and oriented x 3 PSYCHIATRIC:  Normal affect    Signed, Shirlee More, MD  09/22/2019 9:57 AM    [place]

## 2019-09-22 ENCOUNTER — Other Ambulatory Visit: Payer: Self-pay

## 2019-09-22 ENCOUNTER — Encounter: Payer: Self-pay | Admitting: Cardiology

## 2019-09-22 ENCOUNTER — Ambulatory Visit: Payer: Medicare Other | Admitting: Cardiology

## 2019-09-22 VITALS — BP 120/65 | HR 68 | Temp 98.2°F | Ht <= 58 in | Wt 113.8 lb

## 2019-09-22 DIAGNOSIS — E871 Hypo-osmolality and hyponatremia: Secondary | ICD-10-CM | POA: Diagnosis not present

## 2019-09-22 DIAGNOSIS — I10 Essential (primary) hypertension: Secondary | ICD-10-CM | POA: Diagnosis not present

## 2019-09-22 NOTE — Patient Instructions (Addendum)
Medication Instructions:  Your physician recommends that you continue on your current medications as directed. Please refer to the Current Medication list given to you today.  *If you need a refill on your cardiac medications before your next appointment, please call your pharmacy*   Lab Work: None If you have labs (blood work) drawn today and your tests are completely normal, you will receive your results only by: [name] MyChart Message (if you have MyChart) OR . A paper copy in the mail If you have any lab test that is abnormal or we need to change your treatment, we will call you to review the results.   Testing/Procedures: None   Follow-Up: At [facility], you and your health needs are our priority.  As part of our continuing mission to provide you with exceptional heart care, we have created designated Provider Care Teams.  These Care Teams include your primary Cardiologist (physician) and Advanced Practice Providers (APPs -  Physician Assistants and Nurse Practitioners) who all work together to provide you with the care you need, when you need it.  We recommend signing up for the patient portal called "MyChart".  Sign up information is provided on this After Visit Summary.  MyChart is used to connect with patients for Virtual Visits (Telemedicine).  Patients are able to view lab/test results, encounter notes, upcoming appointments, etc.  Non-urgent messages can be sent to your provider as well.   To learn more about what you can do with MyChart, go to [email or web address].    Your next appointment:   3 month(s)  The format for your next appointment:   In Person  Provider:   Shirlee More, MD   Other Instructions If your blood pressure is greater than 185 on the top in the evening take the  0.2 mg tablet of clonidine and rest on the couch for at least 30 minutes.   Continue on your 2L fluid restriction.

## 2019-10-10 ENCOUNTER — Telehealth: Payer: Self-pay | Admitting: Cardiology

## 2019-10-10 NOTE — Telephone Encounter (Signed)
   Pt c/o medication issue:  1. Name of Medication: Gas x  2. How are you currently taking this medication (dosage and times per day)?   3. Are you having a reaction (difficulty breathing--STAT)?   4. What is your medication issue? Pt said she has renal test tomorrow. Per instruction to take 2 extra strength Gas x capsul. She said she couldn't find capsul only on soft gel. She would like to know If that's ok  Please advise

## 2019-10-10 NOTE — Telephone Encounter (Signed)
This is a standard reflex order for renal ultrasound please asked Roselyn Reef to contact the patient and tell her what to do

## 2019-10-11 ENCOUNTER — Other Ambulatory Visit: Payer: Self-pay

## 2019-10-11 ENCOUNTER — Ambulatory Visit (INDEPENDENT_AMBULATORY_CARE_PROVIDER_SITE_OTHER): Payer: Medicare Other

## 2019-10-11 DIAGNOSIS — R0602 Shortness of breath: Secondary | ICD-10-CM | POA: Diagnosis not present

## 2019-10-11 DIAGNOSIS — I10 Essential (primary) hypertension: Secondary | ICD-10-CM

## 2019-10-11 DIAGNOSIS — E871 Hypo-osmolality and hyponatremia: Secondary | ICD-10-CM

## 2019-10-11 NOTE — Progress Notes (Signed)
Renal artery duplex exam has been performed.   Mariah Phillips RDCS, RVT 

## 2019-10-12 ENCOUNTER — Telehealth: Payer: Self-pay

## 2019-10-12 NOTE — Telephone Encounter (Signed)
-----   Message from Richardo Priest, MD sent at 10/12/2019 11:00 AM EDT ----- Normal or stable result  Mild blockage of one renal artery I do not think she requires surgery or stent at this time she is doing well with medications

## 2019-10-12 NOTE — Telephone Encounter (Signed)
Spoke with patient regarding results and recommendation.  Patient verbalizes understanding and is agreeable to plan of care. Advised patient to call back with any issues or concerns.  

## 2019-10-18 ENCOUNTER — Telehealth: Payer: Self-pay | Admitting: Cardiology

## 2019-10-18 NOTE — Telephone Encounter (Signed)
Spoke with Mariah Phillips just now and let her know that the patient was needing a BMP to be drawn. She verbalizes understanding and does not have any other questions.

## 2019-10-18 NOTE — Telephone Encounter (Signed)
New message   [facility] would like to know what labs need to be done because they wants the labs to be in the office today. Please advise.

## 2019-11-09 ENCOUNTER — Other Ambulatory Visit: Payer: Self-pay | Admitting: Cardiology

## 2019-12-22 ENCOUNTER — Other Ambulatory Visit: Payer: Self-pay

## 2019-12-22 ENCOUNTER — Ambulatory Visit: Payer: Medicare Other | Admitting: Cardiology

## 2019-12-22 ENCOUNTER — Encounter: Payer: Self-pay | Admitting: Cardiology

## 2019-12-22 VITALS — BP 138/62 | HR 72 | Ht <= 58 in | Wt 116.2 lb

## 2019-12-22 DIAGNOSIS — I10 Essential (primary) hypertension: Secondary | ICD-10-CM

## 2019-12-22 DIAGNOSIS — I5032 Chronic diastolic (congestive) heart failure: Secondary | ICD-10-CM | POA: Diagnosis not present

## 2019-12-22 DIAGNOSIS — E871 Hypo-osmolality and hyponatremia: Secondary | ICD-10-CM | POA: Diagnosis not present

## 2019-12-22 NOTE — Progress Notes (Signed)
Cardiology Office Note:    Date:  12/22/2019   ID:  Mariah Phillips, DOB [birth year], MRN [id number]  PCP:  Madison Hickman, FNP  Cardiologist:  Shirlee More, MD    Referring MD: Madison Hickman, FNP    ASSESSMENT:    1. Resistant hypertension   2. Hyponatremia   3. Chronic diastolic CHF (congestive heart failure), NYHA class 2 ([facility])    PLAN:    In order of problems listed above:  1. She is much improved BP in general is at range today's hypotension was an outlier if frequent I will reduce her morning hydralazine or discontinue and at this time continue her multidrug regimen including centrally active clonidine hydralazine MRA ARB and thiazide diuretic. 2. Hyperlipidemia stable continue pravastatin labs are followed in her PCP office not available in epic 3. Hyponatremia recheck her BMP today 4. Heart failure is compensated she has no edema   Next appointment: She will follow up with her PCP regarding hypertension I will see her back in my office as needed.   Medication Adjustments/Labs and Tests Ordered: Current medicines are reviewed at length with the patient today.  Concerns regarding medicines are outlined above.  No orders of the defined types were placed in this encounter.  No orders of the defined types were placed in this encounter.   No chief complaint on file.   History of Present Illness:    Mariah Phillips is a 79 y.o. female with a hx of resistant hypertension hyponatremia and shortness of breath with a low proBNP level.  Last seen 09/22/2019.  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 Compliance with diet, lifestyle and medications: Yes  She has done quite well tolerates her medications General blood pressure runs 1 20-1 40 50-60.  Today she is in a rush to get here took clonidine and hydralazine took a shower and her blood pressure was  in the mid 90s and she felt lightheaded this is the only episode.  In my office today her blood pressure is at target.  She tolerates the medications without side effect she feels well I will recheck her renal function today after starting on MRA continue her current antihypertensives I asked her if she is getting frequent episodes of hypotension in the morning let me know and I would address to her hydralazine.  No chest pain edema palpitation or syncope Past Medical History:  Diagnosis Date   Acute kidney injury ([place]) 04/14/2016   Anemia    no blood transfusions in past   Asymptomatic hypertensive urgency 03/27/2015   Bilateral hydronephrosis 04/14/2016   Chronic diastolic CHF (congestive heart failure), NYHA class 2 ([place]) 04/14/2016   Cystocele 03/27/2015   Cystocele, midline    Edema    lower extremities   Esophageal reflux    Fibrocystic breast changes of both breasts 04/14/2016   Foley catheter in place    hx. urinary retention 03-22-15 Foley cath remains in place.   Former smoker 07/19/2018   Hydronephrosis    bilateral   Hyperlipidemia    Hypertension    Hypertension, uncontrolled 01/23/2015   Malignant essential hypertension 01/23/2015   Mixed hyperlipidemia 07/19/2018   Normocytic anemia 02/26/2015   Obstructive uropathy 04/14/2016   Old myocardial infarction    Urinary retention 04/14/2016   Formatting of this note might be different from the original. Last Assessment & Plan:  Foley is draining well today and the bladder spasms have improved with oxybutynin.    She has some pyuria and hematuria on the UA from last night but I had tried to reduce her prolapse while assessing foley placement and the could be the cause.    Continue oxybutynin and foley drainage.   Uterine prolapse     Past Surgical History:  Procedure Laterality Date   ANTERIOR AND POSTERIOR REPAIR N/A 03/27/2015   Procedure: ANTERIOR (CYSTOCELE)  REPAIR ;  Surgeon: Bjorn Loser, MD;   Location: [facility];  Service: Urology;  Laterality: N/A;   CATARACT EXTRACTION  2000/2010   CHOLECYSTECTOMY     '84-open   CYSTOSCOPY WITH RETROGRADE URETHROGRAM Bilateral 03/27/2015   Procedure: CYSTOSCOPY  ;  Surgeon: Bjorn Loser, MD;  Location: [facility];  Service: Urology;  Laterality: Bilateral;   EYE SURGERY     retina surgery 2012   SALPINGOOPHORECTOMY Bilateral 03/27/2015   Procedure: SALPINGO OOPHORECTOMY;  Surgeon: Servando Salina, MD;  Location: [facility];  Service: Gynecology;  Laterality: Bilateral;   TUBAL LIGATION     UPPER GASTROINTESTINAL ENDOSCOPY     with esophageal dilation   VAGINAL HYSTERECTOMY N/A 03/27/2015   Procedure: HYSTERECTOMY VAGINAL;  Surgeon: Servando Salina, MD;  Location: [facility];  Service: Gynecology;  Laterality: N/A;   VAGINAL PROLAPSE REPAIR N/A 03/27/2015   Procedure: VAGINAL VAULT SUSPENSION AND GRAFT;  Surgeon: Bjorn Loser, MD;  Location[facility];  Service: Urology;  Laterality: N/A;    Current Medications: Current Meds  Medication Sig   cloNIDine (CATAPRES) 0.2 MG tablet TAKE ONE TABLET BY MOUTH TWICE DAILY   eplerenone (INSPRA) 25 MG tablet TAKE 1 TABLET BY MOUTH  DAILY   hydrALAZINE (APRESOLINE) 50 MG tablet Take 1 tablet (50 mg total) by mouth every 8 (eight) hours.   Omega-3 Fatty Acids (FISH OIL) 1000 MG CPDR Take 1 capsule by mouth daily.   omeprazole (PRILOSEC) 20 MG capsule Take 1 capsule by mouth every morning.    pravastatin (PRAVACHOL) 20 MG tablet Take 20 mg by mouth every evening.    telmisartan-hydrochlorothiazide (MICARDIS HCT) 40-12.5 MG tablet Take 1 tablet by mouth daily. Take this medication at bedtime.     Allergies:   Amlodipine   Social History   Socioeconomic History   Marital status: Widowed    Spouse name: Not on file   Number of children: Not on file   Years of education: Not on file   Highest education level: Not on file  Occupational History   Not on file  Tobacco Use   Smoking  status: Former Smoker    Quit date: 03/22/1983    Years since quitting: 36.7   Smokeless tobacco: Never Used   Tobacco comment: Quit 1984  Substance and Sexual Activity   Alcohol use: No    Alcohol/week: 0.0 standard drinks   Drug use: No   Sexual activity: Not on file  Other Topics Concern   Not on file  Social History Narrative   Not on file   Social Determinants of Health   Financial Resource Strain:    Difficulty of Paying Living Expenses:   Food Insecurity:    Worried About Charity fundraiser in the Last Year:    Arboriculturist in the Last Year:   Transportation Needs:    Film/video editor (Medical):    Lack of Transportation (Non-Medical):   Physical Activity:    Days of Exercise per Week:    Minutes  of Exercise per Session:   Stress:    Feeling of Stress :   Social Connections:    Frequency of Communication with Friends and Family:    Frequency of Social Gatherings with Friends and Family:    Attends Religious Services:    Active Member of Clubs or Organizations:    Attends Archivist Meetings:    Marital Status:      Family History: The patient's family history includes Heart Problems in her father; Heart attack in her father; Hypertension in her sister; Stroke in her maternal grandmother; Thyroid cancer in her mother. ROS:   Please see the history of present illness.    All other systems reviewed and are negative.  EKGs/Labs/Other Studies Reviewed:    The following studies were reviewed today:    Recent Labs: 09/08/2019: NT-Pro BNP 170 09/20/2019: BUN 33; Creatinine, Ser 1.12; Potassium 5.2; Sodium 132  Recent Lipid Panel    Component Value Date/Time   CHOL 104 03/29/2015 0435   TRIG 91 03/29/2015 0435   HDL 44 03/29/2015 0435   CHOLHDL 2.4 03/29/2015 0435   VLDL 18 03/29/2015 0435   LDLCALC 42 03/29/2015 0435    Physical Exam:    VS:  BP (!) 138/62    Pulse 72    Ht 4\' 7"  (1.397 m)    Wt 116 lb 3.2 oz  (52.7 kg)    SpO2 95%    BMI 27.01 kg/m     Wt Readings from Last 3 Encounters:  12/22/19 116 lb 3.2 oz (52.7 kg)  09/22/19 113 lb 12.8 oz (51.6 kg)  08/25/19 118 lb 12.8 oz (53.9 kg)     GEN:  Well nourished, well developed in no acute distress HEENT: Normal NECK: No JVD; No carotid bruits LYMPHATICS: No lymphadenopathy CARDIAC: RRR, no murmurs, rubs, gallops RESPIRATORY:  Clear to auscultation without rales, wheezing or rhonchi  ABDOMEN: Soft, non-tender, non-distended MUSCULOSKELETAL:  No edema; No deformity  SKIN: Warm and dry NEUROLOGIC:  Alert and oriented x 3 PSYCHIATRIC:  Normal affect    Signed, Shirlee More, MD  12/22/2019 10:40 AM    [place]

## 2019-12-22 NOTE — Addendum Note (Signed)
Addended by: Resa Miner I on: 12/22/2019 10:43 AM   Modules accepted: Orders

## 2019-12-22 NOTE — Patient Instructions (Signed)
Medication Instructions:  Your physician recommends that you continue on your current medications as directed. Please refer to the Current Medication list given to you today.  *If you need a refill on your cardiac medications before your next appointment, please call your pharmacy*   Lab Work: Your physician recommends that you return for lab work in: TODAY BMP If you have labs (blood work) drawn today and your tests are completely normal, you will receive your results only by: MyChart Message (if you have MyChart) OR A paper copy in the mail If you have any lab test that is abnormal or we need to change your treatment, we will call you to review the results.   Testing/Procedures: None   Follow-Up: At [facility], you and your health needs are our priority.  As part of our continuing mission to provide you with exceptional heart care, we have created designated Provider Care Teams.  These Care Teams include your primary Cardiologist (physician) and Advanced Practice Providers (APPs -  Physician Assistants and Nurse Practitioners) who all work together to provide you with the care you need, when you need it.  We recommend signing up for the patient portal called "MyChart".  Sign up information is provided on this After Visit Summary.  MyChart is used to connect with patients for Virtual Visits (Telemedicine).  Patients are able to view lab/test results, encounter notes, upcoming appointments, etc.  Non-urgent messages can be sent to your provider as well.   To learn more about what you can do with MyChart, go to [email or web address].    Your next appointment:   As needed  The format for your next appointment:   In Person  Provider:   Brian Munley, MD    Other Instructions   

## 2019-12-23 ENCOUNTER — Telehealth: Payer: Self-pay

## 2019-12-23 DIAGNOSIS — I5032 Chronic diastolic (congestive) heart failure: Secondary | ICD-10-CM

## 2019-12-23 LAB — BASIC METABOLIC PANEL
BUN/Creatinine Ratio: 19 (ref 12–28)
BUN: 25 mg/dL (ref 8–27)
CO2: 19 mmol/L — ABNORMAL LOW (ref 20–29)
Calcium: 9.1 mg/dL (ref 8.7–10.3)
Chloride: 92 mmol/L — ABNORMAL LOW (ref 96–106)
Creatinine, Ser: 1.29 mg/dL — ABNORMAL HIGH (ref 0.57–1.00)
GFR calc Af Amer: 46 mL/min/{1.73_m2} — ABNORMAL LOW (ref >59)
GFR calc non Af Amer: 40 mL/min/{1.73_m2} — ABNORMAL LOW (ref >59)
Glucose: 100 mg/dL — ABNORMAL HIGH (ref 65–99)
Potassium: 4.8 mmol/L (ref 3.5–5.2)
Sodium: 129 mmol/L — ABNORMAL LOW (ref 134–144)

## 2019-12-23 NOTE — Telephone Encounter (Signed)
-----   Message from Richardo Priest, MD sent at 12/23/2019  7:59 AM EDT ----- Normal or stable result  Continue to fluid restrict 2 to 2.5 L/day  She will need to have a BMP every month with her hyponatremia and renal insufficiency.

## 2019-12-23 NOTE — Telephone Encounter (Signed)
Spoke with patient regarding results and recommendation.  Patient verbalizes understanding and is agreeable to plan of care. Advised patient to call back with any issues or concerns.  

## 2020-01-24 ENCOUNTER — Encounter: Payer: Self-pay | Admitting: Cardiology

## 2020-08-19 ENCOUNTER — Other Ambulatory Visit: Payer: Self-pay | Admitting: Cardiology

## 2020-08-20 NOTE — Telephone Encounter (Signed)
Refill sent to pharmacy.   

## 2021-01-23 DIAGNOSIS — R609 Edema, unspecified: Secondary | ICD-10-CM | POA: Insufficient documentation

## 2021-01-23 DIAGNOSIS — N8111 Cystocele, midline: Secondary | ICD-10-CM | POA: Insufficient documentation

## 2021-01-23 DIAGNOSIS — Z978 Presence of other specified devices: Secondary | ICD-10-CM | POA: Insufficient documentation

## 2021-01-23 DIAGNOSIS — I252 Old myocardial infarction: Secondary | ICD-10-CM | POA: Insufficient documentation

## 2021-01-23 DIAGNOSIS — K219 Gastro-esophageal reflux disease without esophagitis: Secondary | ICD-10-CM | POA: Insufficient documentation

## 2021-01-24 NOTE — Progress Notes (Signed)
Cardiology Office Note:    Date:  01/25/2021   ID:  Mariah Phillips, DOB 26-Jan-1941, MRN [id number]  PCP:  Madison Hickman, FNP  Cardiologist:  Shirlee More, MD    Referring MD: Madison Hickman, FNP    ASSESSMENT:    1. Resistant hypertension   2. Chronic diastolic CHF (congestive heart failure), NYHA class 2 ([place])   3. Hyponatremia    PLAN:    In order of problems listed above:  In general she has done well with previous very resistant high pretension and a multidrug regimen including MRA centrally active clonidine telmisartan thiazide diuretic and hydralazine.  She is presently taking 50 mg of hydralazine twice a day will drop the morning dose trend her blood pressures if a few hours later she is greater than 150 she will go back to 20 5 in the morning 50 in the afternoon.  I have asked her for good technique to use a simple device with 7 times during the day and cautioned her that we should try to micromanage each blood pressure determination. Heart failure is compensated no edema not on loop diuretic continue antihypertensive and MRA Stable hyperlipidemia continue statin Stable I did ask her to stop fluid loading and explained the physiology of delusional hyponatremia she understands   Next appointment: As needed   Medication Adjustments/Labs and Tests Ordered: Current medicines are reviewed at length with the patient today.  Concerns regarding medicines are outlined above.  Orders Placed This Encounter  Procedures   EKG 12-Lead   No orders of the defined types were placed in this encounter.   Chief complaint of concern about occasional lightheadedness and low blood pressure in the morning after my medications  History of Present Illness:    Mariah Phillips is a 80 y.o. female with a hx of resistant hypertension with diastolic heart failure hyponatremia and hyperlipidemia last seen 12/21/2020.  Compliance with diet, lifestyle and medications: Yes Recent labs from 10/27/2020  sodium 130 potassium 4.2 creatinine 1.06 GFR 52.  Cc Cholesterol 153 LDL 79 triglycerides 1 3 HDL 55 C6 0.2% Recent visit with PCP 01/16/2021 blood pressure recorded 160/62  She has been seen today because she reports concern is her blood pressures been overtreated. She shows me a list of blood pressures but unfortunately she is using 2 different blood pressure devices and checking at different times of the day. Reviewed tips for checking blood pressure accurately and I asked her to use the newest device and check the same time each day both the first of the morning few hours after her morning meds and later in the day. In general her blood pressures been in range 1 [id number] systolic. At times in the morning a few hours after taking medications she has blood pressures in the range of 100 210 and she feels weak and lightheaded. She questions whether she needs to drop the early morning hydralazine.  I gave her the option of decreasing the dose or dropping that she prefers to have but the dose but she says that she finds that the blood pressure few hours afterwards is greater than 150 she will take half tablet. She has very severe resistant hypertension I would not stop only 40 Catapres. In general she is done well other than this no cardiovascular symptoms of edema chest pain shortness of breath orthopnea palpitation or syncope. She continues a bit mildly hyponatremic and unfortunate as opposed to water restricting she is fluid loading and I told her to  stop forcing liquids delusional hyponatremia Past Medical History:  Diagnosis Date   Acute kidney injury ([place]) 04/14/2016   Anemia    no blood transfusions in past   Asymptomatic hypertensive urgency 03/27/2015   Bilateral hydronephrosis 04/14/2016   Chronic diastolic CHF (congestive heart failure), NYHA class 2 ([place]) 04/14/2016   Cystocele 03/27/2015   Cystocele, midline    Edema    lower extremities   Esophageal reflux    Fibrocystic breast  changes of both breasts 04/14/2016   Foley catheter in place    hx. urinary retention 03-22-15 Foley cath remains in place.   Former smoker 07/19/2018   Hydronephrosis    bilateral   Hyperlipidemia    Hypertension    Hypertension, uncontrolled 01/23/2015   Malignant essential hypertension 01/23/2015   Mixed hyperlipidemia 07/19/2018   Normocytic anemia 02/26/2015   Obstructive uropathy 04/14/2016   Old myocardial infarction    Urinary retention 04/14/2016   Formatting of this note might be different from the original. Last Assessment & Plan:  Foley is draining well today and the bladder spasms have improved with oxybutynin.    She has some pyuria and hematuria on the UA from last night but I had tried to reduce her prolapse while assessing foley placement and the could be the cause.    Continue oxybutynin and foley drainage.   Uterine prolapse     Past Surgical History:  Procedure Laterality Date   ANTERIOR AND POSTERIOR REPAIR N/A 03/27/2015   Procedure: ANTERIOR (CYSTOCELE)  REPAIR ;  Surgeon: Bjorn Loser, MD;  Location: [facility];  Service: Urology;  Laterality: N/A;   CATARACT EXTRACTION  2000/2010   CHOLECYSTECTOMY     '84-open   CYSTOSCOPY WITH RETROGRADE URETHROGRAM Bilateral 03/27/2015   Procedure: CYSTOSCOPY  ;  Surgeon: Bjorn Loser, MD;  Location: [facility];  Service: Urology;  Laterality: Bilateral;   EYE SURGERY     retina surgery 2012   SALPINGOOPHORECTOMY Bilateral 03/27/2015   Procedure: SALPINGO OOPHORECTOMY;  Surgeon: Servando Salina, MD;  Location: [facility];  Service: Gynecology;  Laterality: Bilateral;   TUBAL LIGATION     UPPER GASTROINTESTINAL ENDOSCOPY     with esophageal dilation   VAGINAL HYSTERECTOMY N/A 03/27/2015   Procedure: HYSTERECTOMY VAGINAL;  Surgeon: Servando Salina, MD;  Location: [facility];  Service: Gynecology;  Laterality: N/A;   VAGINAL PROLAPSE REPAIR N/A 03/27/2015   Procedure: VAGINAL VAULT SUSPENSION AND GRAFT;  Surgeon: Bjorn Loser,  MD;  Location: [facility];  Service: Urology;  Laterality: N/A;    Current Medications: Current Meds  Medication Sig   cloNIDine (CATAPRES) 0.2 MG tablet TAKE ONE TABLET BY MOUTH TWICE DAILY   eplerenone (INSPRA) 25 MG tablet TAKE 1 TABLET BY MOUTH  DAILY   Omega-3 Fatty Acids (FISH OIL) 1000 MG CPDR Take 1 capsule by mouth daily.   omeprazole (PRILOSEC) 20 MG capsule Take 1 capsule by mouth every morning.    pravastatin (PRAVACHOL) 20 MG tablet Take 20 mg by mouth every evening.    telmisartan-hydrochlorothiazide (MICARDIS HCT) 40-12.5 MG tablet Take 1 tablet by mouth daily. Take this medication at bedtime.   [DISCONTINUED] hydrALAZINE (APRESOLINE) 50 MG tablet Take 1 tablet (50 mg total) by mouth every 8 (eight) hours. (Patient taking differently: Take 50 mg by mouth 2 (two) times daily.)     Allergies:   Amlodipine   Social History   Socioeconomic History   Marital status: Widowed    Spouse name: Not on file   Number of children:  Not on file   Years of education: Not on file   Highest education level: Not on file  Occupational History   Not on file  Tobacco Use   Smoking status: Former    Types: Cigarettes    Quit date: 03/22/1983    Years since quitting: 37.8   Smokeless tobacco: Never   Tobacco comments:    Quit 1984  Substance and Sexual Activity   Alcohol use: No    Alcohol/week: 0.0 standard drinks   Drug use: No   Sexual activity: Not on file  Other Topics Concern   Not on file  Social History Narrative   Not on file   Social Determinants of Health   Financial Resource Strain: Not on file  Food Insecurity: Not on file  Transportation Needs: Not on file  Physical Activity: Not on file  Stress: Not on file  Social Connections: Not on file     Family History: The patient's family history includes Heart Problems in her father; Heart attack in her father; Hypertension in her sister; Stroke in her maternal grandmother; Thyroid cancer in her mother. ROS:    Please see the history of present illness.    All other systems reviewed and are negative.  EKGs/Labs/Other Studies Reviewed:    The following studies were reviewed today:  EKG:  EKG ordered today and personally reviewed.  The ekg ordered today demonstrates sinus rhythm 57 bpm normal EKG    Physical Exam:    VS:  BP (!) 154/70 (BP Location: Right Arm, Patient Position: Sitting, Cuff Size: Normal)   Pulse (!) 57   Ht '4\' 7"'$  (1.397 m)   Wt 113 lb 1.9 oz (51.3 kg)   SpO2 97%   BMI 26.29 kg/m     Wt Readings from Last 3 Encounters:  01/25/21 113 lb 1.9 oz (51.3 kg)  12/22/19 116 lb 3.2 oz (52.7 kg)  09/22/19 113 lb 12.8 oz (51.6 kg)     GEN:  Well nourished, well developed in no acute distress HEENT: Normal NECK: No JVD; No carotid bruits LYMPHATICS: No lymphadenopathy CARDIAC: RRR, no murmurs, rubs, gallops RESPIRATORY:  Clear to auscultation without rales, wheezing or rhonchi  ABDOMEN: Soft, non-tender, non-distended MUSCULOSKELETAL:  No edema; No deformity  SKIN: Warm and dry NEUROLOGIC:  Alert and oriented x 3 PSYCHIATRIC:  Normal affect    Signed, Shirlee More, MD  01/25/2021 12:52 PM    [facility]

## 2021-01-25 ENCOUNTER — Encounter: Payer: Self-pay | Admitting: Cardiology

## 2021-01-25 ENCOUNTER — Other Ambulatory Visit: Payer: Self-pay

## 2021-01-25 ENCOUNTER — Ambulatory Visit: Payer: Medicare Other | Admitting: Cardiology

## 2021-01-25 VITALS — BP 154/70 | HR 57 | Ht <= 58 in | Wt 113.1 lb

## 2021-01-25 DIAGNOSIS — I5032 Chronic diastolic (congestive) heart failure: Secondary | ICD-10-CM | POA: Diagnosis not present

## 2021-01-25 DIAGNOSIS — I10 Essential (primary) hypertension: Secondary | ICD-10-CM | POA: Diagnosis not present

## 2021-01-25 DIAGNOSIS — E871 Hypo-osmolality and hyponatremia: Secondary | ICD-10-CM | POA: Diagnosis not present

## 2021-01-25 NOTE — Patient Instructions (Addendum)
Medication Instructions:  Your physician has recommended you make the following change in your medication:  Stop Hydralazine  If needed restart at 12.5 mg (1/2 tablet) daily.  *If you need a refill on your cardiac medications before your next appointment, please call your pharmacy*   Lab Work: None ordered If you have labs (blood work) drawn today and your tests are completely normal, you will receive your results only by: [place] (if you have MyChart) OR A paper copy in the mail If you have any lab test that is abnormal or we need to change your treatment, we will call you to review the results.   Testing/Procedures: None ordered   Follow-Up: At [facility], you and your health needs are our priority.  As part of our continuing mission to provide you with exceptional heart care, we have created designated Provider Care Teams.  These Care Teams include your primary Cardiologist (physician) and Advanced Practice Providers (APPs -  Physician Assistants and Nurse Practitioners) who all work together to provide you with the care you need, when you need it.  We recommend signing up for the patient portal called "MyChart".  Sign up information is provided on this After Visit Summary.  MyChart is used to connect with patients for Virtual Visits (Telemedicine).  Patients are able to view lab/test results, encounter notes, upcoming appointments, etc.  Non-urgent messages can be sent to your provider as well.   To learn more about what you can do with MyChart, go to [email or web address].    Your next appointment:   As needed  The format for your next appointment:   In Person  Provider:   Shirlee More, MD   Other Instructions NA

## 2023-12-04 ENCOUNTER — Other Ambulatory Visit: Payer: Self-pay | Admitting: Cardiology

## 2024-01-15 ENCOUNTER — Other Ambulatory Visit: Payer: Self-pay

## 2024-01-15 DIAGNOSIS — I739 Peripheral vascular disease, unspecified: Secondary | ICD-10-CM

## 2024-02-17 ENCOUNTER — Encounter: Payer: Self-pay | Admitting: Vascular Surgery

## 2024-02-17 ENCOUNTER — Ambulatory Visit (HOSPITAL_COMMUNITY)
Admission: RE | Admit: 2024-02-17 | Discharge: 2024-02-17 | Disposition: A | Discharge status: Home / Self Care | Source: Ambulatory Visit | Attending: Vascular Surgery | Admitting: Vascular Surgery

## 2024-02-17 ENCOUNTER — Ambulatory Visit (INDEPENDENT_AMBULATORY_CARE_PROVIDER_SITE_OTHER): Admitting: Vascular Surgery

## 2024-02-17 VITALS — BP 152/75 | HR 64 | Temp 98.1°F | Ht <= 58 in | Wt 107.0 lb

## 2024-02-17 DIAGNOSIS — R6889 Other general symptoms and signs: Secondary | ICD-10-CM | POA: Diagnosis not present

## 2024-02-17 DIAGNOSIS — I739 Peripheral vascular disease, unspecified: Secondary | ICD-10-CM | POA: Insufficient documentation

## 2024-02-17 LAB — VAS US ABI WITH/WO TBI
Left ABI: 1.1
Right ABI: 1.04

## 2024-02-17 NOTE — Progress Notes (Signed)
 Patient ID: Mariah Phillips, female   DOB: [birth year], 83 y.o.   MRN: [id number]  Reason for Consult: New Patient (Initial Visit)   Referred by Mariah Chow, MD  Subjective:     HPI:  Mariah Phillips is a 83 y.o. female without history of vascular disease.  She does have a history of hypertension and hyperlipidemia and heart failure.  She denies any lower extremity symptoms.  She does not have any personal or family history of aneurysm disease and has never had stroke, TIA or amaurosis.  She had home screening of her lower extremities with abnormal ABIs and is now here for follow-up.  Past Medical History:  Diagnosis Date   Acute kidney injury 04/14/2016   Anemia    no blood transfusions in past   Asymptomatic hypertensive urgency 03/27/2015   Bilateral hydronephrosis 04/14/2016   Chronic diastolic CHF (congestive heart failure), NYHA class 2 ([facility]) 04/14/2016   Cystocele 03/27/2015   Cystocele, midline    Edema    lower extremities   Esophageal reflux    Fibrocystic breast changes of both breasts 04/14/2016   Foley catheter in place    hx. urinary retention 03-22-15 Foley cath remains in place.   Former smoker 07/19/2018   Hydronephrosis    bilateral   Hyperlipidemia    Hypertension    Hypertension, uncontrolled 01/23/2015   Malignant essential hypertension 01/23/2015   Mixed hyperlipidemia 07/19/2018   Normocytic anemia 02/26/2015   Obstructive uropathy 04/14/2016   Old myocardial infarction    Urinary retention 04/14/2016   Formatting of this note might be different from the original. Last Assessment & Plan:  Foley is draining well today and the bladder spasms have improved with oxybutynin .    She has some pyuria and hematuria on the UA from last night but I had tried to reduce her prolapse while assessing foley placement and the could be the cause.    Continue oxybutynin  and foley drainage.   Uterine prolapse    Family History  Problem Relation Age of Onset   Thyroid  cancer Mother     Heart Problems Father    Heart attack Father    Hypertension Sister    Stroke Maternal Grandmother    Past Surgical History:  Procedure Laterality Date   ANTERIOR AND POSTERIOR REPAIR N/A 03/27/2015   Procedure: ANTERIOR (CYSTOCELE)  REPAIR ;  Surgeon: Glendia Elizabeth, MD;  Location[facility];  Service: Urology;  Laterality: N/A;   CATARACT EXTRACTION  2000/2010   CHOLECYSTECTOMY     '84-open   CYSTOSCOPY WITH RETROGRADE URETHROGRAM Bilateral 03/27/2015   Procedure: CYSTOSCOPY  ;  Surgeon: Glendia Elizabeth, MD;  Location: [facility];  Service: Urology;  Laterality: Bilateral;   EYE SURGERY     retina surgery 2012   SALPINGOOPHORECTOMY Bilateral 03/27/2015   Procedure: SALPINGO OOPHORECTOMY;  Surgeon: Dickie Carder, MD;  Location: [facility];  Service: Gynecology;  Laterality: Bilateral;   TUBAL LIGATION     UPPER GASTROINTESTINAL ENDOSCOPY     with esophageal dilation   VAGINAL HYSTERECTOMY N/A 03/27/2015   Procedure: HYSTERECTOMY VAGINAL;  Surgeon: Dickie Carder, MD;  Location: [facility];  Service: Gynecology;  Laterality: N/A;   VAGINAL PROLAPSE REPAIR N/A 03/27/2015   Procedure: VAGINAL VAULT SUSPENSION AND GRAFT;  Surgeon: Glendia Elizabeth, MD;  Location[facility];  Service: Urology;  Laterality: N/A;    Short Social History:  Social History   Tobacco Use   Smoking status: Former    Current packs/day: 0.00  Types: Cigarettes    Quit date: 03/22/1983    Years since quitting: 40.9   Smokeless tobacco: Never   Tobacco comments:    Quit 1984  Substance Use Topics   Alcohol use: No    Alcohol/week: 0.0 standard drinks of alcohol    Allergies  Allergen Reactions   Amlodipine  Swelling    Lower extremity edema - has occurred with 3 rechallenges.    Current Outpatient Medications  Medication Sig Dispense Refill   cloNIDine  (CATAPRES ) 0.2 MG tablet TAKE ONE TABLET BY MOUTH TWICE DAILY 60 tablet 5   eplerenone  (INSPRA ) 25 MG tablet TAKE 1 TABLET BY MOUTH  DAILY 90 tablet 3    Omega-3 Fatty Acids (FISH OIL) 1000 MG CPDR Take 1 capsule by mouth daily.     omeprazole (PRILOSEC) 20 MG capsule Take 1 capsule by mouth every morning.   4   pravastatin  (PRAVACHOL ) 20 MG tablet Take 20 mg by mouth every evening.      telmisartan -hydrochlorothiazide  (MICARDIS  HCT) 40-12.5 MG tablet Take 1 tablet by mouth daily. Take this medication at bedtime. Please call to schedule an appointment with Dr. Redell Leiter for future refills. Thank you. 1st attempt. 30 tablet 0   No current facility-administered medications for this visit.    Review of Systems  Constitutional:  Constitutional negative. HENT: HENT negative.  Eyes: Eyes negative.  Respiratory: Respiratory negative.  Cardiovascular: Cardiovascular negative.  GI: Gastrointestinal negative.  Musculoskeletal: Musculoskeletal negative.  Skin: Skin negative.  Neurological: Neurological negative. Hematologic: Hematologic/lymphatic negative.  Psychiatric: Psychiatric negative.        Objective:  Objective   Vitals:   02/17/24 0933  BP: (!) 152/75  Pulse: 64  Temp: 98.1 F (36.7 C)  SpO2: 96%  Weight: 107 lb (48.5 kg)  Height: 4' 7 (1.397 m)   Body mass index is 24.87 kg/m.  Physical Exam HENT:     Head: Normocephalic.     Nose: Nose normal.  Eyes:     Pupils: Pupils are equal, round, and reactive to light.  Cardiovascular:     Rate and Rhythm: Normal rate.  Pulmonary:     Effort: Pulmonary effort is normal.  Abdominal:     General: Abdomen is flat.  Musculoskeletal:        General: Normal range of motion.     Cervical back: Normal range of motion.     Right lower leg: No edema.     Left lower leg: No edema.  Skin:    General: Skin is warm.     Capillary Refill: Capillary refill takes less than 2 seconds.  Neurological:     General: No focal deficit present.     Mental Status: She is alert.  Psychiatric:        Mood and Affect: Mood normal.        Thought Content: Thought content normal.         Judgment: Judgment normal.     Data: ABI Findings:  +---------+------------------+-----+---------+--------+  Right   Rt Pressure (mmHg)IndexWaveform Comment   +---------+------------------+-----+---------+--------+  Brachial 175                                       +---------+------------------+-----+---------+--------+  PTA     186               1.04 triphasic          +---------+------------------+-----+---------+--------+  DP  171               0.96 triphasic          +---------+------------------+-----+---------+--------+  Great Toe141               0.79 Normal             +---------+------------------+-----+---------+--------+   +---------+------------------+-----+---------+-------+  Left    Lt Pressure (mmHg)IndexWaveform Comment  +---------+------------------+-----+---------+-------+  Brachial 178                                      +---------+------------------+-----+---------+-------+  PTA     196               1.10 triphasic         +---------+------------------+-----+---------+-------+  DP      189               1.06 triphasic         +---------+------------------+-----+---------+-------+  Great Toe149               0.84 Normal            +---------+------------------+-----+---------+-------+   +-------+-----------+-----------+------------+------------+  ABI/TBIToday's ABIToday's TBIPrevious ABIPrevious TBI  +-------+-----------+-----------+------------+------------+  Right 1.04       0.79                                 +-------+-----------+-----------+------------+------------+  Left  1.10       0.84                                 +-------+-----------+-----------+------------+------------+        Summary:  Right: Resting right ankle-brachial index is within normal range. The  right toe-brachial index is normal.    Left: Resting left ankle-brachial index is within normal  range. The left  toe-brachial index is normal.         Assessment/Plan:     83 year old female here with abnormal ABI screening remains asymptomatic and ABIs were normal on our exam with normal pulse exam and feet appear well-perfused.  She does not appear to have any underlying vascular disease and can see me on an as-needed basis.     Penne Lonni Colorado MD [facility]

## 2024-02-24 ENCOUNTER — Encounter: Admitting: Vascular Surgery

## 2024-02-24 ENCOUNTER — Encounter (HOSPITAL_COMMUNITY)
